# Patient Record
Sex: Female | Born: 1995 | Race: Black or African American | Hispanic: No | Marital: Single | Smoking: Former smoker
Health system: Southern US, Community
[De-identification: ages and names within clinical notes are randomized; demographics above are authoritative.]

## PROBLEM LIST (undated history)

## (undated) ENCOUNTER — Emergency Department (HOSPITAL_COMMUNITY): Admission: EM | Payer: Medicaid Other | Source: Home / Self Care

## (undated) DIAGNOSIS — D573 Sickle-cell trait: Secondary | ICD-10-CM

## (undated) DIAGNOSIS — F419 Anxiety disorder, unspecified: Secondary | ICD-10-CM

## (undated) DIAGNOSIS — F319 Bipolar disorder, unspecified: Secondary | ICD-10-CM

## (undated) DIAGNOSIS — N390 Urinary tract infection, site not specified: Secondary | ICD-10-CM

## (undated) HISTORY — PX: FOOT SURGERY: SHX648

## (undated) HISTORY — PX: DILATION AND CURETTAGE OF UTERUS: SHX78

## (undated) HISTORY — PX: NO PAST SURGERIES: SHX2092

## (undated) HISTORY — DX: Bipolar disorder, unspecified: F31.9

---
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
## 2014-02-03 ENCOUNTER — Encounter: Payer: Self-pay | Admitting: Obstetrics & Gynecology

## 2016-12-14 ENCOUNTER — Encounter (HOSPITAL_COMMUNITY): Payer: Self-pay

## 2016-12-14 DIAGNOSIS — Z3A10 10 weeks gestation of pregnancy: Secondary | ICD-10-CM | POA: Insufficient documentation

## 2016-12-14 DIAGNOSIS — J069 Acute upper respiratory infection, unspecified: Secondary | ICD-10-CM | POA: Insufficient documentation

## 2016-12-14 NOTE — ED Triage Notes (Signed)
Pt complaining of cough x 2 days. Pt denies any fevers or body aches. Pt denies any chest pain or SOB. Pt vss.

## 2016-12-15 ENCOUNTER — Emergency Department (HOSPITAL_COMMUNITY)
Admission: EM | Admit: 2016-12-15 | Discharge: 2016-12-15 | Disposition: A | Discharge status: Home / Self Care | Payer: Self-pay | Attending: Emergency Medicine | Admitting: Emergency Medicine

## 2016-12-15 DIAGNOSIS — J069 Acute upper respiratory infection, unspecified: Secondary | ICD-10-CM

## 2016-12-15 DIAGNOSIS — B9789 Other viral agents as the cause of diseases classified elsewhere: Secondary | ICD-10-CM

## 2016-12-15 NOTE — ED Provider Notes (Signed)
[facility]-EMERGENCY DEPT Provider Note   CSN: [id number] Arrival date & time: 12/14/16  2334  By signing my name below, I, Doreatha Martin, attest that this documentation has been prepared under the direction and in the presence of Brynnlee Cumpian, MD. Electronically Signed: Doreatha Martin, ED Scribe. 12/15/16. 3:46 AM.     History   Chief Complaint Chief Complaint  Patient presents with  . Cough  . Routine Prenatal Visit    HPI Laneya Gasaway is a 21 y.o. female G1P0 10w who presents to the Emergency Department complaining of constant nasal congestion x 2 days with associated cough. She states she has tried cough drops with no relief. She states she is not sure what she can take with her pregnancy. No worsening factors noted. Pt was put on bed rest by an ER physician after vaginal bleeding. She denies additional complaints.    The history is provided by the patient. No language interpreter was used.  Cough  This is a new problem. The current episode started 2 days ago. The problem occurs every few minutes. The problem has not changed since onset.The cough is non-productive. There has been no fever. Pertinent negatives include no chest pain, no chills, no shortness of breath and no wheezing. She has tried nothing (cough drops) for the symptoms. The treatment provided no relief. Her past medical history does not include bronchitis or pneumonia.    No past medical history on file.  There are no active problems to display for this patient.   No past surgical history on file.  OB History    Gravida Para Term Preterm AB Living   1             SAB TAB Ectopic Multiple Live Births                   Home Medications    Prior to Admission medications   Medication Sig Start Date End Date Taking? Authorizing Provider  amoxicillin (AMOXIL) 500 MG capsule Take 1 capsule (500 mg total) by mouth 3 (three) times daily. 11/14/12   Sunnie Nielsen, MD  ibuprofen (ADVIL,MOTRIN) 800 MG tablet Take 1 tablet  (800 mg total) by mouth 3 (three) times daily. 11/14/12   Sunnie Nielsen, MD  oxyCODONE-acetaminophen (PERCOCET/ROXICET) 5-325 MG per tablet Take 2 tablets by mouth every 4 (four) hours as needed for pain. 11/14/12   Sunnie Nielsen, MD    Family History No family history on file.  Social History Social History  Substance Use Topics  . Smoking status: Never Smoker  . Smokeless tobacco: Never Used  . Alcohol use No     Allergies   Patient has no allergy information on record.   Review of Systems Review of Systems  Constitutional: Negative for appetite change, chills and fever.  HENT: Positive for congestion. Negative for drooling and facial swelling.   Eyes: Negative for photophobia.  Respiratory: Positive for cough. Negative for shortness of breath, wheezing and stridor.   Cardiovascular: Negative for chest pain, palpitations and leg swelling.  Gastrointestinal: Negative for anal bleeding.  Genitourinary: Negative for difficulty urinating.  Musculoskeletal: Negative for neck stiffness.  Skin: Negative for pallor.  Neurological: Negative for facial asymmetry and speech difficulty.  Psychiatric/Behavioral: Negative for suicidal ideas.  All other systems reviewed and are negative.    Physical Exam Updated Vital Signs BP 122/68 (BP Location: Right Arm)   Pulse 85   Temp 98.4 F (36.9 C) (Oral)   Resp 18   Ht 5'  9" (1.753 m)   Wt 127 lb (57.6 kg)   LMP 10/29/2016 (Approximate)   SpO2 100%   BMI 18.75 kg/m   Physical Exam  Constitutional: She appears well-developed and well-nourished. No distress.  HENT:  Head: Normocephalic and atraumatic.  Mouth/Throat: Uvula is midline, oropharynx is clear and moist and mucous membranes are normal. No oropharyngeal exudate.  Eyes: Conjunctivae are normal. Pupils are equal, round, and reactive to light.  Neck: Normal range of motion. Neck supple.  No carotid bruits.   Cardiovascular: Normal rate, regular rhythm, normal heart sounds and  intact distal pulses.  Exam reveals no gallop and no friction rub.   No murmur heard. Pulmonary/Chest: Effort normal and breath sounds normal. No respiratory distress. She has no wheezes. She has no rales.  Abdominal: Soft. Bowel sounds are normal. She exhibits no distension. There is no tenderness. There is no guarding.  Musculoskeletal: Normal range of motion. She exhibits no edema, tenderness or deformity.  Neurological: She is alert. She displays normal reflexes.  Skin: Skin is warm and dry. Capillary refill takes less than 2 seconds. She is not diaphoretic.  Psychiatric: She has a normal mood and affect. Her behavior is normal.  Nursing note and vitals reviewed.    ED Treatments / Results   Vitals:   12/15/16 0330 12/15/16 0410  BP: 110/68 (!) 102/52  Pulse: 87 86  Resp:  16  Temp:      DIAGNOSTIC STUDIES: Oxygen Saturation is 100% on RA, normal by my interpretation.    COORDINATION OF CARE: 3:41 AM Discussed treatment plan with pt at bedside which includes supportive home care and pt agreed to plan.    Procedures Procedures (including critical care time)    Final Clinical Impressions(s) / ED Diagnoses  URI during pregnancy.  Tea with lemon and honey saline nasal spray follow up with your OB.  Return immediately for fever >101, chest pain shortness of breath, productive cough, intractable pain, weakness, bleeding or any concerns. Follow up with your own doctor for ongoing concerns.   The patient is nontoxic-appearing on exam and vital signs are within normal limits.   I have reviewed the triage vital signs and the nursing notes. Pertinent labs &imaging results that were available during my care of the patient were reviewed by me and considered in my medical decision making (see chart for details).  After history, exam, and medical workup I feel the patient has been appropriately medically screened and is safe for discharge home. Pertinent diagnoses were  discussed with the patient. Patient was given return precautions.    I personally performed the services described in this documentation, which was scribed in my presence. The recorded information has been reviewed and is accurate.     Keyleen Cerrato, MD 12/15/16 [phone]

## 2016-12-17 LAB — OB RESULTS CONSOLE RPR: RPR: NONREACTIVE

## 2016-12-17 LAB — OB RESULTS CONSOLE HGB/HCT, BLOOD
HEMATOCRIT: 41
HEMOGLOBIN: 13.6

## 2016-12-17 LAB — OB RESULTS CONSOLE PLATELET COUNT: PLATELETS: 270

## 2016-12-17 LAB — OB RESULTS CONSOLE RUBELLA ANTIBODY, IGM: RUBELLA: IMMUNE

## 2016-12-17 LAB — OB RESULTS CONSOLE HEPATITIS B SURFACE ANTIGEN: HEP B S AG: NEGATIVE

## 2016-12-17 LAB — OB RESULTS CONSOLE HIV ANTIBODY (ROUTINE TESTING): HIV: NONREACTIVE

## 2017-06-05 ENCOUNTER — Emergency Department (HOSPITAL_COMMUNITY): Payer: Federal, State, Local not specified - PPO

## 2017-06-05 ENCOUNTER — Other Ambulatory Visit: Payer: Self-pay

## 2017-06-05 ENCOUNTER — Emergency Department (HOSPITAL_COMMUNITY)
Admission: EM | Admit: 2017-06-05 | Discharge: 2017-06-06 | Disposition: A | Discharge status: Home / Self Care | Payer: Federal, State, Local not specified - PPO | Attending: Emergency Medicine | Admitting: Emergency Medicine

## 2017-06-05 ENCOUNTER — Encounter (HOSPITAL_COMMUNITY): Payer: Self-pay | Admitting: Emergency Medicine

## 2017-06-05 DIAGNOSIS — G43009 Migraine without aura, not intractable, without status migrainosus: Secondary | ICD-10-CM

## 2017-06-05 DIAGNOSIS — R531 Weakness: Secondary | ICD-10-CM | POA: Diagnosis not present

## 2017-06-05 DIAGNOSIS — O99355 Diseases of the nervous system complicating the puerperium: Secondary | ICD-10-CM | POA: Diagnosis not present

## 2017-06-05 DIAGNOSIS — R200 Anesthesia of skin: Secondary | ICD-10-CM | POA: Diagnosis not present

## 2017-06-05 DIAGNOSIS — Z3A32 32 weeks gestation of pregnancy: Secondary | ICD-10-CM | POA: Diagnosis not present

## 2017-06-05 HISTORY — DX: Urinary tract infection, site not specified: N39.0

## 2017-06-05 LAB — CBC WITH DIFFERENTIAL/PLATELET
Basophils Absolute: 0 10*3/uL (ref 0.0–0.1)
Basophils Relative: 0 %
EOS ABS: 0.1 10*3/uL (ref 0.0–0.7)
Eosinophils Relative: 1 %
HEMATOCRIT: 29.8 % — AB (ref 36.0–46.0)
HEMOGLOBIN: 10.1 g/dL — AB (ref 12.0–15.0)
LYMPHS ABS: 2.3 10*3/uL (ref 0.7–4.0)
Lymphocytes Relative: 29 %
MCH: 27.3 pg (ref 26.0–34.0)
MCHC: 33.9 g/dL (ref 30.0–36.0)
MCV: 80.5 fL (ref 78.0–100.0)
MONOS PCT: 10 %
Monocytes Absolute: 0.8 10*3/uL (ref 0.1–1.0)
NEUTROS PCT: 60 %
Neutro Abs: 4.5 10*3/uL (ref 1.7–7.7)
Platelets: 267 10*3/uL (ref 150–400)
RBC: 3.7 MIL/uL — ABNORMAL LOW (ref 3.87–5.11)
RDW: 13 % (ref 11.5–15.5)
WBC: 7.7 10*3/uL (ref 4.0–10.5)

## 2017-06-05 LAB — COMPREHENSIVE METABOLIC PANEL
ALK PHOS: 91 U/L (ref 38–126)
ALT: 16 U/L (ref 14–54)
ANION GAP: 8 (ref 5–15)
AST: 25 U/L (ref 15–41)
Albumin: 3.3 g/dL — ABNORMAL LOW (ref 3.5–5.0)
BILIRUBIN TOTAL: 0.5 mg/dL (ref 0.3–1.2)
BUN: <5 mg/dL — ABNORMAL LOW (ref 6–20)
CHLORIDE: 103 mmol/L (ref 101–111)
CO2: 26 mmol/L (ref 22–32)
CREATININE: 0.59 mg/dL (ref 0.44–1.00)
Calcium: 8.9 mg/dL (ref 8.9–10.3)
GFR calc non Af Amer: >60 mL/min (ref >60)
Glucose, Bld: 76 mg/dL (ref 65–99)
POTASSIUM: 3.2 mmol/L — AB (ref 3.5–5.1)
SODIUM: 137 mmol/L (ref 135–145)
Total Protein: 6.2 g/dL — ABNORMAL LOW (ref 6.5–8.1)

## 2017-06-05 MED ORDER — LORAZEPAM 2 MG/ML IJ SOLN
1.0000 mg | Freq: Once | INTRAMUSCULAR | Status: AC
  Administered 2017-06-05: 1 mg via INTRAVENOUS

## 2017-06-05 MED ORDER — PROCHLORPERAZINE EDISYLATE 5 MG/ML IJ SOLN
10.0000 mg | Freq: Once | INTRAMUSCULAR | Status: AC
  Administered 2017-06-05: 10 mg via INTRAVENOUS
  Filled 2017-06-05: qty 2

## 2017-06-05 MED ORDER — DIPHENHYDRAMINE HCL 50 MG/ML IJ SOLN
25.0000 mg | Freq: Once | INTRAMUSCULAR | Status: AC
  Administered 2017-06-05: 25 mg via INTRAVENOUS
  Filled 2017-06-05: qty 1

## 2017-06-05 MED ORDER — LORAZEPAM 2 MG/ML IJ SOLN
INTRAMUSCULAR | Status: AC
  Filled 2017-06-05: qty 1

## 2017-06-05 MED ORDER — SODIUM CHLORIDE 0.9 % IV BOLUS (SEPSIS)
1000.0000 mL | Freq: Once | INTRAVENOUS | Status: AC
  Administered 2017-06-05: 1000 mL via INTRAVENOUS

## 2017-06-05 NOTE — ED Notes (Signed)
MD Garza at bedside 

## 2017-06-05 NOTE — ED Notes (Signed)
Pt more anxious and agitated after compazine and benadryl administration. MD little notified. Order for 25 more mg benadryl.

## 2017-06-05 NOTE — ED Notes (Signed)
Pt resting at this time.

## 2017-06-05 NOTE — ED Notes (Signed)
OB RR RN at bedside. 

## 2017-06-05 NOTE — ED Provider Notes (Signed)
[facility] EMERGENCY DEPARTMENT Provider Note   CSN: [id number] Arrival date & time: 06/05/17  2115 History   Chief Complaint Chief Complaint  Patient presents with  . Numbness   HPI Tina Thomas is a 21 y.o. female who is 32 weeks pregnancy presents with acute onset headache associated with visual loss in the right eye and patient states she was cooking when she developed sudden pain in the left side of her head and then noted that she could not see had numbness in her right face upper extremity.  Patient states symptoms have been persistent since onset was approximately 45 minutes ago.  She patient denies being his major symptoms better or worse.  States that she has had similar symptoms previously with headache and tingling while pregnant, however symptoms have not been this severe.  She is extremely tearful and anxious.  HPI  Past Medical History:  Diagnosis Date  . UTI (urinary tract infection)    There are no active problems to display for this patient.  History reviewed. No pertinent surgical history.  OB History    Gravida Para Term Preterm AB Living   1             SAB TAB Ectopic Multiple Live Births                 Home Medications    Prior to Admission medications   Medication Sig Start Date End Date Taking? Authorizing Provider  amoxicillin (AMOXIL) 500 MG capsule Take 1 capsule (500 mg total) by mouth 3 (three) times daily. 11/14/12   Sunnie Nielsen, MD  ibuprofen (ADVIL,MOTRIN) 800 MG tablet Take 1 tablet (800 mg total) by mouth 3 (three) times daily. 11/14/12   Sunnie Nielsen, MD  oxyCODONE-acetaminophen (PERCOCET/ROXICET) 5-325 MG per tablet Take 2 tablets by mouth every 4 (four) hours as needed for pain. 11/14/12   Sunnie Nielsen, MD   Family History No family history on file.  Social History Social History   Tobacco Use  . Smoking status: Never Smoker  . Smokeless tobacco: Never Used  Substance Use Topics  . Alcohol use: No  . Drug use:  No   Allergies   Patient has no known allergies.  Review of Systems Review of Systems  Constitutional: Negative for chills and fever.  HENT: Negative for ear pain and sore throat.   Eyes: Positive for visual disturbance. Negative for pain.  Respiratory: Negative for cough and shortness of breath.   Cardiovascular: Negative for chest pain and palpitations.  Gastrointestinal: Negative for abdominal pain and vomiting.  Genitourinary: Negative for dysuria and hematuria.  Musculoskeletal: Negative for arthralgias and back pain.  Skin: Negative for color change and rash.  Neurological: Positive for weakness, numbness and headaches. Negative for seizures and syncope.  All other systems reviewed and are negative.  Physical Exam Updated Vital Signs BP 133/76   Pulse 97   Temp (!) 97.4 F (36.3 C) (Oral)   Resp 16   LMP 10/29/2016 (Approximate)   SpO2 100%   Physical Exam  Constitutional: She is oriented to person, place, and time. She appears well-developed and well-nourished. No distress.  HENT:  Head: Normocephalic and atraumatic.  Eyes: Conjunctivae and EOM are normal. Pupils are equal, round, and reactive to light.  Neck: Normal range of motion. Neck supple.  Cardiovascular: Normal rate and regular rhythm.  No murmur heard. Pulmonary/Chest: Effort normal and breath sounds normal. Tachypnea noted. No respiratory distress.  Abdominal: Soft. There is no tenderness.  Musculoskeletal: She exhibits no edema.  Neurological: She is alert and oriented to person, place, and time. She displays normal reflexes. No cranial nerve deficit or sensory deficit. She exhibits normal muscle tone. Coordination normal.  CN 2-12 normal, no facial droop 5/5 left grip strength 5/5 left flexion and extension of the wrist 5/5 left bicep and triceps extension Difficult to obtain exam of the RUE as the patient is poorly cooperative and extremely anxious, however is noted to be able to move the RUE  spontaneously and purposefull Normal strength and ROM of the bilateral lower extremities  Normal gait  Skin: Skin is warm and dry.  Psychiatric: Her mood appears anxious.  Nursing note and vitals reviewed.  ED Treatments / Results  Labs (all labs ordered are listed, but only abnormal results are displayed) Labs Reviewed  CBC WITH DIFFERENTIAL/PLATELET - Abnormal; Notable for the following components:      Result Value   RBC 3.70 (*)    Hemoglobin 10.1 (*)    HCT 29.8 (*)    All other components within normal limits  COMPREHENSIVE METABOLIC PANEL - Abnormal; Notable for the following components:   Potassium 3.2 (*)    BUN <5 (*)    Total Protein 6.2 (*)    Albumin 3.3 (*)    All other components within normal limits  URINALYSIS, ROUTINE W REFLEX MICROSCOPIC   EKG  EKG Interpretation  Date/Time:  Thursday June 05 2017 21:34:46 EST Ventricular Rate:  92 PR Interval:    QRS Duration: 78 QT Interval:  343 QTC Calculation: 420 R Axis:   58 Text Interpretation:  Sinus rhythm Ventricular premature complex Nonspecific T abnormalities, anterior leads T wave inversion III, V3, no previous Confirmed by Frederick PeersLittle, Rachel [phone]([id number]) on 06/05/2017 10:23:43 PM     Radiology Ct Head Wo Contrast  Result Date: 06/06/2017 CLINICAL DATA:  21 year old female with right-sided weakness and numbness and blurred vision. Concern for intracranial hemorrhage. EXAM: CT HEAD WITHOUT CONTRAST TECHNIQUE: Contiguous axial images were obtained from the base of the skull through the vertex without intravenous contrast. COMPARISON:  None. FINDINGS: Brain: The ventricles and sulci appropriate size for patient's age. A subcentimeter hypodense focus in the region of the left basal ganglia (series 3, image 13) likely represents a prevascular space. An age indeterminate small lacunar infarct is less likely. Clinical correlation is recommended. There is no acute intracranial hemorrhage. No mass effect or midline shift  noted. No extra-axial fluid collection. Vascular: No hyperdense vessel or unexpected calcification. Skull: Normal. Negative for fracture or focal lesion. Sinuses/Orbits: No acute finding. Other: None IMPRESSION: 1. No acute intracranial hemorrhage. 2. Tiny hypodense focus in the region of the left basal ganglia, likely a prevascular space. If symptoms persist, and there are no contraindications, MRI may provide better evaluation if clinically indicated. Electronically Signed   By: Elgie CollardArash  Radparvar M.D.   On: 06/06/2017 00:05    Procedures Procedures (including critical care time)  Medications Ordered in ED Medications  sodium chloride 0.9 % bolus 1,000 mL (1,000 mLs Intravenous New Bag/Given 06/05/17 2206)  diphenhydrAMINE (BENADRYL) injection 25 mg (25 mg Intravenous Given 06/05/17 2206)  prochlorperazine (COMPAZINE) injection 10 mg (10 mg Intravenous Given 06/05/17 2206)  diphenhydrAMINE (BENADRYL) injection 25 mg (25 mg Intravenous Given 06/05/17 2227)  LORazepam (ATIVAN) injection 1 mg (1 mg Intravenous Given 06/05/17 2238)   Initial Impression / Assessment and Plan / ED Course  I have reviewed the triage vital signs and the nursing notes.  Pertinent labs &  imaging results that were available during my care of the patient were reviewed by me and considered in my medical decision making (see chart for details).  Tina MattersJada Thomas 21 year old female who is currently [redacted] weeks pregnant who presents with acute onset left-sided headache associated with right-sided vision loss and right upper extremity numbness and weakness.  My initial assessment patient is tearful and appears extremely anxious.  She is difficult to console.  She is hemodynamically stable with normal heart rate and normal blood pressure on my assessment.  My neuro exam patient's pupils are equal round and reactive to light with extraocular movement intact.  Patient denies that she is anything out of the right eye.  Although she  endorses numbness and weakness in the right upper extremity she withdraws to pain and is able to move the right upper extremity purposefully and spontaneously.  Fetal heart rate obtained on bedside ultrasound revealed heart rate of 140.  CT head ordered due to concerns for possible subarachnoid hemorrhage due to patient's description of headache with neurological symptoms.  Patient IV pain medications for concerns of possible complex migraine  CT head full report detailed above in short review no acute intracranial hemorrhage, tiny hypodense region in the left basal ganglia representative of perivascular space  My reassessment the patient after receiving medications patient is sleepy but states that her symptoms have completely resolved.  She states that she no longer has a headache and that her vision is normal and that she has no numbness or weakness in the right upper extremity.  On my neurological exam she has full grip strength, bicep flexion, and tricep extension on the right and left upper extremities.  Discussed incidental findings on CT scan with the patient and her mom at bedside.  And that I discussed these with Dr. Laurence SlateAroor, neurology who reviewed the patient's images and agrees that most likely prevascular space and of little significance if the patient's symptoms have completely resolved.  At this time is a patient is asymptomatic and has a normal neurological exam feel that she is medically cleared.  Patient cleared obstetrically will be as her blood pressure has been within normal range she has no proteinuria, normal LFTs, and normal platelets. Please refer to their note for further information.  At this time consider the patient is safe and stable for discharge.  Discussed following up with neurology outpatient for further evaluation of complex migraines.  Strict return precautions given for recurrent symptoms, new or worsening symptoms.  Patient and her mother at bedside are in  agreement with plan.  Given information on resources to set up obstetric care in the area as the patient has recently moved here from [place].  Final Clinical Impressions(s) / ED Diagnoses   Final diagnoses:  Migraine without aura and without status migrainosus, not intractable   ED Discharge Orders    None       Lamont SnowballGarza, Idali Lafever, MD 06/07/17 0037    Clarene DukeLittle, Ambrose Finlandachel Morgan, MD 06/08/17 [phone]

## 2017-06-05 NOTE — ED Triage Notes (Addendum)
Pt arrives via POV with sudden onset R weakness, numbness and tingling. Pt also reports R eye blurred vision and vomiting. Denies hx migraines, but reports headaches. Estimated LSW 2045. Dr. Clarene DukeLittle at bedside at this time - no code stroke at this time per Dr. Clarene DukeLittle.

## 2017-06-05 NOTE — ED Notes (Signed)
Continued agitation, unable to sit still in bed. MD notified, see new orders.

## 2017-06-06 LAB — URINALYSIS, ROUTINE W REFLEX MICROSCOPIC
Bilirubin Urine: NEGATIVE
GLUCOSE, UA: NEGATIVE mg/dL
HGB URINE DIPSTICK: NEGATIVE
Ketones, ur: NEGATIVE mg/dL
LEUKOCYTES UA: NEGATIVE
Nitrite: NEGATIVE
PH: 7 (ref 5.0–8.0)
Protein, ur: NEGATIVE mg/dL
SPECIFIC GRAVITY, URINE: 1.005 (ref 1.005–1.030)

## 2017-06-06 LAB — PROTEIN / CREATININE RATIO, URINE
Creatinine, Urine: 41.71 mg/dL
Total Protein, Urine: <6 mg/dL

## 2017-06-06 LAB — RAPID URINE DRUG SCREEN, HOSP PERFORMED
AMPHETAMINES: NOT DETECTED
Barbiturates: NOT DETECTED
Benzodiazepines: NOT DETECTED
COCAINE: NOT DETECTED
OPIATES: NOT DETECTED
TETRAHYDROCANNABINOL: NOT DETECTED

## 2017-06-06 NOTE — Discharge Instructions (Addendum)
There was an abnormality on you CT scan that we discussed: Tiny hypodense focus in the region of the left basal ganglia, likely a prevascular space. These findings were discussed the neurology who at this time feels that they are not likely significant as all your symptoms have resolved. However it would be reasonable for you to follow up with neurology in 1-2 weeks for reassessment and evaluation of complex migraine.  At this time your symptoms are most likely due to a complex migraine headache as they have resolved with medication and with resolution of your headache. Should you experience symptoms like this again I advise that you return immediately to the emergency department for re-evaluation.

## 2017-06-06 NOTE — ED Notes (Signed)
Patient left at this time with all belongings. 

## 2017-06-06 NOTE — Progress Notes (Signed)
Dr Erin FullingHarraway-Smith called with PCR results, CT results, and EFM; patient states she feels better and blurred vision and headache are gone; orders given to clear patient obstetrically and patient able to go home and follow up in office; ED physician made aware at this time

## 2017-06-06 NOTE — ED Notes (Signed)
Pt refusing in and out cath, attempted bedpan. Wants to sit on pan for a little while. OB RR RN remains at bedside

## 2017-06-06 NOTE — Progress Notes (Signed)
RROB called to patient's bedside who presents to [facility] ED with severe headache focused on the left side with blurred vision and right sided weakness; patient is a G1P0 who is [redacted] weeks along in her pregnancy at this time; patient's mom stated she was eating something then started vomiting and immediately afterwards the pain started to get worse; EFM applied;  patient was given benadryl IV and compazine IV and patient became very restless and anxious; removing monitors and flipping around in bed; unable to keep patient on EFM; patient given ativan IV and became less agitated at that point; [facility] Dr Erin FullingHarraway-Smith called at this time and informed of patient's complaints, EFM tracing, and VS orders given to keep patient on EFM at this time and obtain a protein creat ratio and wait for CT results; will continue to monitor

## 2017-06-06 NOTE — ED Notes (Signed)
MD Miquel DunnGarza at bedside

## 2017-06-06 NOTE — ED Notes (Signed)
Pt attempted to void x1 with bedpan, unsuccessful. Will reattempt

## 2017-07-22 NOTE — L&D Delivery Note (Signed)
Patient is 22 y.o. G1P0 6448w3d admitted for SOL. S/p Pitocin. AROM at 1539.  Prenatal course also complicated by poor prenatal records as [place]  Delivery Note At 6:47 PM a viable female was delivered via  (Presentation: ROA;  ).  APGAR: pending; weight  pending.   Placenta status: spontaneous, intact.  Cord: 3 vessel  Anesthesia:  Epidural Episiotomy:  None Lacerations: None Est. Blood Loss (mL):  50 cc   Head delivered ROA. No nuchal cord present. Shoulder and body delivered. Posterior arm delivered before anterior shoulder. Infant with spontaneous cry, placed on mother's abdomen, dried and bulb suctioned. Cord clamped x 2 after 1-minute delay, and cut by family member. Cord blood drawn. Placenta delivered spontaneously with gentle cord traction. Fundus firm with massage and Pitocin. Perineum inspected and found to have no laceration, which was found to be hemostatic.  Mom to postpartum.  Baby to Couplet care / Skin to Skin.  Sherin Darin Engelsbraham 07/27/2017, 6:57 PM I was present and supervised this delivery

## 2017-07-27 ENCOUNTER — Observation Stay (HOSPITAL_COMMUNITY): Payer: Federal, State, Local not specified - PPO | Admitting: Anesthesiology

## 2017-07-27 ENCOUNTER — Inpatient Hospital Stay (HOSPITAL_COMMUNITY)
Admission: AD | Admit: 2017-07-27 | Discharge: 2017-07-29 | DRG: 807 | Disposition: A | Discharge status: Home / Self Care | Payer: Federal, State, Local not specified - PPO | Source: Ambulatory Visit | Attending: Obstetrics and Gynecology | Admitting: Obstetrics and Gynecology

## 2017-07-27 ENCOUNTER — Encounter (HOSPITAL_COMMUNITY): Payer: Self-pay

## 2017-07-27 ENCOUNTER — Other Ambulatory Visit: Payer: Self-pay

## 2017-07-27 DIAGNOSIS — Z87891 Personal history of nicotine dependence: Secondary | ICD-10-CM | POA: Diagnosis not present

## 2017-07-27 DIAGNOSIS — Z3483 Encounter for supervision of other normal pregnancy, third trimester: Secondary | ICD-10-CM | POA: Diagnosis present

## 2017-07-27 DIAGNOSIS — I10 Essential (primary) hypertension: Secondary | ICD-10-CM | POA: Diagnosis not present

## 2017-07-27 DIAGNOSIS — Z3490 Encounter for supervision of normal pregnancy, unspecified, unspecified trimester: Secondary | ICD-10-CM

## 2017-07-27 DIAGNOSIS — O135 Gestational [pregnancy-induced] hypertension without significant proteinuria, complicating the puerperium: Principal | ICD-10-CM | POA: Diagnosis not present

## 2017-07-27 DIAGNOSIS — Z3A38 38 weeks gestation of pregnancy: Secondary | ICD-10-CM | POA: Diagnosis not present

## 2017-07-27 HISTORY — DX: Sickle-cell trait: D57.3

## 2017-07-27 LAB — DIFFERENTIAL
Basophils Absolute: 0 10*3/uL (ref 0.0–0.1)
Basophils Relative: 0 %
EOS ABS: 0.1 10*3/uL (ref 0.0–0.7)
EOS PCT: 1 %
LYMPHS ABS: 2.1 10*3/uL (ref 0.7–4.0)
Lymphocytes Relative: 26 %
Monocytes Absolute: 0.6 10*3/uL (ref 0.1–1.0)
Monocytes Relative: 8 %
NEUTROS PCT: 65 %
Neutro Abs: 5.4 10*3/uL (ref 1.7–7.7)

## 2017-07-27 LAB — ABO/RH: ABO/RH(D): B POS

## 2017-07-27 LAB — RAPID HIV SCREEN (HIV 1/2 AB+AG)
HIV 1/2 ANTIBODIES: NONREACTIVE
HIV-1 P24 ANTIGEN - HIV24: NONREACTIVE

## 2017-07-27 LAB — CBC
HCT: 33.2 % — ABNORMAL LOW (ref 36.0–46.0)
Hemoglobin: 11.4 g/dL — ABNORMAL LOW (ref 12.0–15.0)
MCH: 26.5 pg (ref 26.0–34.0)
MCHC: 34.3 g/dL (ref 30.0–36.0)
MCV: 77.2 fL — AB (ref 78.0–100.0)
PLATELETS: 294 10*3/uL (ref 150–400)
RBC: 4.3 MIL/uL (ref 3.87–5.11)
RDW: 13.8 % (ref 11.5–15.5)
WBC: 8.2 10*3/uL (ref 4.0–10.5)

## 2017-07-27 LAB — RAPID URINE DRUG SCREEN, HOSP PERFORMED
Amphetamines: NOT DETECTED
BARBITURATES: NOT DETECTED
BENZODIAZEPINES: NOT DETECTED
COCAINE: NOT DETECTED
OPIATES: NOT DETECTED
Tetrahydrocannabinol: NOT DETECTED

## 2017-07-27 LAB — TYPE AND SCREEN
ABO/RH(D): B POS
ANTIBODY SCREEN: NEGATIVE

## 2017-07-27 LAB — HEPATITIS B SURFACE ANTIGEN: Hepatitis B Surface Ag: NEGATIVE

## 2017-07-27 MED ORDER — LIDOCAINE HCL (PF) 1 % IJ SOLN
INTRAMUSCULAR | Status: DC | PRN
  Administered 2017-07-27 (×2): 5 mL

## 2017-07-27 MED ORDER — SOD CITRATE-CITRIC ACID 500-334 MG/5ML PO SOLN: 30.0000 mL | ORAL | Status: DC | PRN

## 2017-07-27 MED ORDER — DIPHENHYDRAMINE HCL 50 MG/ML IJ SOLN
12.5000 mg | INTRAMUSCULAR | Status: DC | PRN
  Administered 2017-07-27: 12.5 mg via INTRAVENOUS
  Filled 2017-07-27: qty 1

## 2017-07-27 MED ORDER — LACTATED RINGERS IV SOLN: 500.0000 mL | INTRAVENOUS | Status: DC | PRN

## 2017-07-27 MED ORDER — SENNOSIDES-DOCUSATE SODIUM 8.6-50 MG PO TABS
2.0000 | ORAL_TABLET | ORAL | Status: DC
  Administered 2017-07-28 (×2): 2 via ORAL
  Filled 2017-07-27 (×2): qty 2

## 2017-07-27 MED ORDER — ACETAMINOPHEN 325 MG PO TABS: 650.0000 mg | ORAL_TABLET | ORAL | Status: DC | PRN

## 2017-07-27 MED ORDER — EPHEDRINE 5 MG/ML INJ
10.0000 mg | INTRAVENOUS | Status: DC | PRN
  Filled 2017-07-27: qty 2

## 2017-07-27 MED ORDER — COCONUT OIL OIL
1.0000 "application " | TOPICAL_OIL | Status: DC | PRN
  Administered 2017-07-28: 1 via TOPICAL
  Filled 2017-07-27: qty 120

## 2017-07-27 MED ORDER — OXYCODONE-ACETAMINOPHEN 5-325 MG PO TABS: 2.0000 | ORAL_TABLET | ORAL | Status: DC | PRN

## 2017-07-27 MED ORDER — LACTATED RINGERS IV SOLN
500.0000 mL | Freq: Once | INTRAVENOUS | Status: AC
  Administered 2017-07-27: 500 mL via INTRAVENOUS

## 2017-07-27 MED ORDER — LACTATED RINGERS IV SOLN
INTRAVENOUS | Status: DC
  Administered 2017-07-27: 14:00:00 via INTRAVENOUS

## 2017-07-27 MED ORDER — HYDROXYZINE HCL 50 MG/ML IM SOLN
50.0000 mg | Freq: Four times a day (QID) | INTRAMUSCULAR | Status: DC | PRN
  Administered 2017-07-27: 50 mg via INTRAMUSCULAR
  Filled 2017-07-27 (×2): qty 1

## 2017-07-27 MED ORDER — WITCH HAZEL-GLYCERIN EX PADS: 1.0000 "application " | MEDICATED_PAD | CUTANEOUS | Status: DC | PRN

## 2017-07-27 MED ORDER — ONDANSETRON HCL 4 MG/2ML IJ SOLN: 4.0000 mg | Freq: Four times a day (QID) | INTRAMUSCULAR | Status: DC | PRN

## 2017-07-27 MED ORDER — LIDOCAINE HCL (PF) 1 % IJ SOLN
30.0000 mL | INTRAMUSCULAR | Status: DC | PRN
  Filled 2017-07-27: qty 30

## 2017-07-27 MED ORDER — ACETAMINOPHEN 325 MG PO TABS
650.0000 mg | ORAL_TABLET | ORAL | Status: DC | PRN
  Administered 2017-07-29: 650 mg via ORAL
  Filled 2017-07-27: qty 2

## 2017-07-27 MED ORDER — OXYTOCIN 40 UNITS IN LACTATED RINGERS INFUSION - SIMPLE MED
1.0000 m[IU]/min | INTRAVENOUS | Status: DC
  Administered 2017-07-27: 4 m[IU]/min via INTRAVENOUS

## 2017-07-27 MED ORDER — PRENATAL MULTIVITAMIN CH
1.0000 | ORAL_TABLET | Freq: Every day | ORAL | Status: DC
  Administered 2017-07-28 – 2017-07-29 (×2): 1 via ORAL
  Filled 2017-07-27 (×2): qty 1

## 2017-07-27 MED ORDER — SIMETHICONE 80 MG PO CHEW: 80.0000 mg | CHEWABLE_TABLET | ORAL | Status: DC | PRN

## 2017-07-27 MED ORDER — OXYTOCIN BOLUS FROM INFUSION
500.0000 mL | Freq: Once | INTRAVENOUS | Status: AC
  Administered 2017-07-27: 500 mL via INTRAVENOUS

## 2017-07-27 MED ORDER — PHENYLEPHRINE 40 MCG/ML (10ML) SYRINGE FOR IV PUSH (FOR BLOOD PRESSURE SUPPORT)
80.0000 ug | PREFILLED_SYRINGE | INTRAVENOUS | Status: DC | PRN
  Filled 2017-07-27: qty 10
  Filled 2017-07-27: qty 5

## 2017-07-27 MED ORDER — TETANUS-DIPHTH-ACELL PERTUSSIS 5-2.5-18.5 LF-MCG/0.5 IM SUSP: 0.5000 mL | Freq: Once | INTRAMUSCULAR | Status: DC

## 2017-07-27 MED ORDER — DIPHENHYDRAMINE HCL 25 MG PO CAPS: 25.0000 mg | ORAL_CAPSULE | Freq: Four times a day (QID) | ORAL | Status: DC | PRN

## 2017-07-27 MED ORDER — BENZOCAINE-MENTHOL 20-0.5 % EX AERO
1.0000 "application " | INHALATION_SPRAY | CUTANEOUS | Status: DC | PRN
  Administered 2017-07-27: 1 via TOPICAL
  Filled 2017-07-27: qty 56

## 2017-07-27 MED ORDER — OXYTOCIN 40 UNITS IN LACTATED RINGERS INFUSION - SIMPLE MED
2.5000 [IU]/h | INTRAVENOUS | Status: DC
  Administered 2017-07-27: 2.5 [IU]/h via INTRAVENOUS
  Filled 2017-07-27: qty 1000

## 2017-07-27 MED ORDER — ONDANSETRON HCL 4 MG PO TABS: 4.0000 mg | ORAL_TABLET | ORAL | Status: DC | PRN

## 2017-07-27 MED ORDER — TERBUTALINE SULFATE 1 MG/ML IJ SOLN
0.2500 mg | Freq: Once | INTRAMUSCULAR | Status: DC | PRN
  Filled 2017-07-27: qty 1

## 2017-07-27 MED ORDER — FENTANYL 2.5 MCG/ML BUPIVACAINE 1/10 % EPIDURAL INFUSION (WH - ANES)
14.0000 mL/h | INTRAMUSCULAR | Status: DC | PRN
  Administered 2017-07-27: 14 mL/h via EPIDURAL
  Filled 2017-07-27: qty 100

## 2017-07-27 MED ORDER — OXYCODONE-ACETAMINOPHEN 5-325 MG PO TABS: 1.0000 | ORAL_TABLET | ORAL | Status: DC | PRN

## 2017-07-27 MED ORDER — HYDROXYZINE HCL 50 MG PO TABS: 25.0000 mg | ORAL_TABLET | Freq: Once | ORAL | Status: DC

## 2017-07-27 MED ORDER — IBUPROFEN 600 MG PO TABS
600.0000 mg | ORAL_TABLET | Freq: Four times a day (QID) | ORAL | Status: DC
  Administered 2017-07-28 – 2017-07-29 (×6): 600 mg via ORAL
  Filled 2017-07-27 (×7): qty 1

## 2017-07-27 MED ORDER — ONDANSETRON HCL 4 MG/2ML IJ SOLN: 4.0000 mg | INTRAMUSCULAR | Status: DC | PRN

## 2017-07-27 MED ORDER — DIBUCAINE 1 % RE OINT: 1.0000 "application " | TOPICAL_OINTMENT | RECTAL | Status: DC | PRN

## 2017-07-27 MED ORDER — ZOLPIDEM TARTRATE 5 MG PO TABS: 5.0000 mg | ORAL_TABLET | Freq: Every evening | ORAL | Status: DC | PRN

## 2017-07-27 MED ORDER — PHENYLEPHRINE 40 MCG/ML (10ML) SYRINGE FOR IV PUSH (FOR BLOOD PRESSURE SUPPORT)
80.0000 ug | PREFILLED_SYRINGE | INTRAVENOUS | Status: DC | PRN
Start: 2017-07-27 — End: 2017-07-27
  Filled 2017-07-27: qty 5

## 2017-07-27 NOTE — Progress Notes (Addendum)
G1 @ 38.[redacted] wksga. Presents to triage for r/o labor. States ctx stronger today than previous days. Denies LOF but has bloody show, dark pinkish mucus. +FM. EFM applied.   SVE 3/60/-2  VSS see flow sheet for details.   1207: provider notified. Report status of pt given. Orders received to walk pt for an hr and recheck cervix.  1210: pt sent walking. Instructed to return at 1300  1310: back from walking. Pt states pain is too strong to cont.  Provider notified. Report given. Orders received to admit pt.   1325: Birthing charge nurse notified. Report status of pt given. Room assigned to 170.   1437: charge nurse ordered to sent pt to birthing without labs and IV since MAU too busy.

## 2017-07-27 NOTE — Plan of Care (Signed)
Progressing appropriately.

## 2017-07-27 NOTE — Anesthesia Preprocedure Evaluation (Signed)

## 2017-07-27 NOTE — MAU Note (Signed)
Contractions started 2 days ago-now are every 5-496min, hasn't slept in 2 days.  Had some watery d/c 2 days ago, bleeding yesterday.  Was 2 cm when last checked.

## 2017-07-27 NOTE — Progress Notes (Signed)
Tina MattersJada Thomas is a [age] y.o. G1P0 at 5528w3d no records as [place] for early labor  Subjective: Patient doing well. Cousin and aunt at bedside for support. Patient reports some back pain. States "i'll be fine as long as I have ice cream and pain meds". Reports she is having a girl, desires to breast feed, desires depo provera shot for birth control.   Objective: BP 137/75   Pulse 77   Temp 98 F (36.7 C) (Oral)   Resp 16   Ht 5\' 9"  (1.753 m)   Wt 70 kg (154 lb 4 oz)   LMP 10/29/2016 (Approximate)   SpO2 100%   BMI 22.78 kg/m  No intake/output data recorded. No intake/output data recorded.  FHT:  FHR: 130 bpm, variability: moderate,  accelerations:  Abscent,  decelerations:  Present early UC:   irregular, every 1-3 minutes SVE:   Dilation: 5 Effacement (%): 90, 100 Station: 0 Exam by:: Tina CruiseHeather Thomas RNC   Labs: Lab Results  Component Value Date   WBC 8.2 07/27/2017   HGB 11.4 (L) 07/27/2017   HCT 33.2 (L) 07/27/2017   MCV 77.2 (L) 07/27/2017   PLT 294 07/27/2017    Assessment / Plan: Spontaneous labor, progressing normally  Labor: Progressing normally. Rapid HIV non-reactive, UDS neg, B positive blood type Fetal Wellbeing:  Category I Pain Control:  Epidural I/D:  n/a Anticipated MOD:  NSVD  Tina Thomas 07/27/2017, 4:23 PM

## 2017-07-27 NOTE — H&P (Signed)
Tina MattersJada Thomas is a 22 y.o. female G1 @ 38.3 wks presenting for contractions. [place] Will attempt to get records. OB History    Gravida Para Term Preterm AB Living   1             SAB TAB Ectopic Multiple Live Births                 Past Medical History:  Diagnosis Date  . UTI (urinary tract infection)    Past Surgical History:  Procedure Laterality Date  . NO PAST SURGERIES     Family History: family history includes Cancer in her maternal grandmother; Diabetes in her sister; Hypertension in her mother. Social History:  reports that she has quit smoking. she has never used smokeless tobacco. She reports that she drinks alcohol. She reports that she does not use drugs.     Maternal Diabetes: No Genetic Screening: Normal Maternal Ultrasounds/Referrals: Normal Fetal Ultrasounds or other Referrals:  None Maternal Substance Abuse:  No Significant Maternal Medications:  None Significant Maternal Lab Results:  None Other Comments:  do not have pts prenatal records. Care in [place]  Review of Systems  Constitutional: Negative.   HENT: Negative.   Eyes: Negative.   Respiratory: Negative.   Cardiovascular: Negative.   Gastrointestinal: Positive for abdominal pain.  Genitourinary: Negative.   Musculoskeletal: Negative.   Skin: Negative.   Neurological: Negative.   Endo/Heme/Allergies: Negative.   Psychiatric/Behavioral: Negative.    Maternal Medical History:  Reason for admission: Contractions.   Contractions: Onset was 13-24 hours ago.   Frequency: regular.   Perceived severity is moderate.    Fetal activity: Perceived fetal activity is normal.   Last perceived fetal movement was within the past hour.    Prenatal complications: no prenatal complications Prenatal Complications - Diabetes: none.    Dilation: 3.5 Effacement (%): 60 Station: 0 Exam by:: Charna ArcherJ McClellan, RN Weight 154 lb 4 oz (70 kg), last menstrual period  10/29/2016. Maternal Exam:  Uterine Assessment: Contraction strength is moderate.  Contraction frequency is regular.   Abdomen: Patient reports no abdominal tenderness. Fetal presentation: vertex  Introitus: Normal vulva. Normal vagina.  Ferning test: not done.  Nitrazine test: not done.  Pelvis: adequate for delivery.   Cervix: Cervix evaluated by digital exam.     Fetal Exam Fetal Monitor Review: Mode: ultrasound.   Baseline rate: 140's.  Variability: moderate (6-25 bpm).   Pattern: accelerations present.    Fetal State Assessment: Category I - tracings are normal.     Physical Exam  Constitutional: She is oriented to person, place, and time. She appears well-developed and well-nourished.  HENT:  Head: Normocephalic.  Eyes: Pupils are equal, round, and reactive to light.  Neck: Normal range of motion.  Cardiovascular: Normal rate, regular rhythm, normal heart sounds and intact distal pulses.  Respiratory: Effort normal and breath sounds normal.  GI: Soft. Bowel sounds are normal.  Genitourinary: Vagina normal and uterus normal.  Musculoskeletal: Normal range of motion.  Neurological: She is alert and oriented to person, place, and time. She has normal reflexes.  Skin: Skin is warm and dry.  Psychiatric: She has a normal mood and affect. Her behavior is normal. Judgment and thought content normal.    Prenatal labs: ABO, Rh:   Antibody:   Rubella:   RPR:    HBsAg:    HIV:    GBS:     Assessment/Plan: early labor @ 38.3 wks GBS unknown  Care in [place].  Prenatal panel Drug screen  Wyvonnia Dusky 07/27/2017, 1:20 PM

## 2017-07-27 NOTE — Anesthesia Procedure Notes (Signed)
Epidural Patient location during procedure: OB  Staffing Anesthesiologist: Laquandra Carrillo, MD Performed: anesthesiologist   Preanesthetic Checklist Completed: patient identified, site marked, surgical consent, pre-op evaluation, timeout performed, IV checked, risks and benefits discussed and monitors and equipment checked  Epidural Patient position: sitting Prep: DuraPrep Patient monitoring: heart rate, continuous pulse ox and blood pressure Approach: right paramedian Location: L4-L5 Injection technique: LOR saline  Needle:  Needle type: Tuohy  Needle gauge: 17 G Needle length: 9 cm and 9 Needle insertion depth: 5 cm Catheter type: closed end flexible Catheter size: 20 Guage Catheter at skin depth: 9 cm Test dose: negative  Assessment Events: blood not aspirated, injection not painful, no injection resistance, negative IV test and no paresthesia  Additional Notes Patient identified. Risks/Benefits/Options discussed with patient including but not limited to bleeding, infection, nerve damage, paralysis, failed block, incomplete pain control, headache, blood pressure changes, nausea, vomiting, reactions to medication both or allergic, itching and postpartum back pain. Confirmed with bedside nurse the patient's most recent platelet count. Confirmed with patient that they are not currently taking any anticoagulation, have any bleeding history or any family history of bleeding disorders. Patient expressed understanding and wished to proceed. All questions were answered. Sterile technique was used throughout the entire procedure. Please see nursing notes for vital signs. Test dose was given through epidural needle and negative prior to continuing to dose epidural or start infusion. Warning signs of high block given to the patient including shortness of breath, tingling/numbness in hands, complete motor block, or any concerning symptoms with instructions to call for help. Patient was given  instructions on fall risk and not to get out of bed. All questions and concerns addressed with instructions to call with any issues.     

## 2017-07-28 DIAGNOSIS — Z3A38 38 weeks gestation of pregnancy: Secondary | ICD-10-CM | POA: Diagnosis not present

## 2017-07-28 LAB — RPR: RPR: NONREACTIVE

## 2017-07-28 LAB — RUBELLA SCREEN: Rubella: 3.93 index (ref >0.99)

## 2017-07-28 NOTE — Anesthesia Postprocedure Evaluation (Signed)
Anesthesia Post Note  Patient: Tina Thomas  Procedure(s) Performed: AN AD HOC LABOR EPIDURAL     Patient location during evaluation: Mother Baby Anesthesia Type: Epidural Level of consciousness: awake and alert and oriented Pain management: pain level controlled Vital Signs Assessment: post-procedure vital signs reviewed and stable Respiratory status: spontaneous breathing Cardiovascular status: stable Postop Assessment: no headache, no backache, epidural receding, patient able to bend at knees, no apparent nausea or vomiting and adequate PO intake Anesthetic complications: no    Last Vitals:  Vitals:   07/27/17 2130 07/28/17 0130  BP: 134/83 126/73  Pulse: 80 86  Resp: 18 18  Temp: 36.7 C 37.3 C  SpO2:      Last Pain:  Vitals:   07/28/17 0845  TempSrc:   PainSc: 0-No pain   Pain Goal: Patients Stated Pain Goal: 5 (07/27/17 1122)               Tina Thomas

## 2017-07-28 NOTE — Progress Notes (Signed)
MOB was referred for history of depression/anxiety. * Referral screened out by Clinical Social Worker because none of the following criteria appear to apply: ~ History of anxiety/depression during this pregnancy, or of post-partum depression. ~ Diagnosis of anxiety and/or depression within last 3 years OR * MOB's symptoms currently being treated with medication and/or therapy. Please contact the Clinical Social Worker if needs arise, by MOB request, or if MOB scores greater than 9/yes to question 10 on Edinburgh Postpartum Depression Screen.  Kemontae Dunklee Boyd-Gilyard, MSW, LCSW Clinical Social Work [phone] 

## 2017-07-28 NOTE — Progress Notes (Signed)
Post Partum Day 1 Subjective: no complaints, up ad lib, voiding and tolerating PO  Objective: Blood pressure 126/73, pulse 86, temperature 99.1 F (37.3 C), temperature source Oral, resp. rate 18, height 5\' 9"  (1.753 m), weight 71.3 kg (157 lb 1.6 oz), last menstrual period 10/29/2016, SpO2 100 %.  Physical Exam:  General: alert, cooperative and no distress Lochia: appropriate Uterine Fundus: firm DVT Evaluation: No evidence of DVT seen on physical exam.  Recent Labs    07/27/17 1356  HGB 11.4*  HCT 33.2*    Assessment/Plan: Plan for discharge tomorrow and Breastfeeding  Continue care   LOS: 1 day   Caryl AdaJazma Jonisha Kindig, DO 07/28/2017, 9:07 AM

## 2017-07-28 NOTE — Lactation Note (Signed)
This note was copied from a baby's chart. Lactation Consultation Note  Patient Name: Tina Thomas ONGEX'BToday's Date: 07/28/2017 Reason for consult: Follow-up assessment   P1, Baby 18 hours old.  < 6 lbs.  Latch is improving. Mother requested assistance w/ breastfeeding. Baby is tongue thrusting and is having difficulty sustaining latch. Demonstrated tongue thrusting.  Mother hand expressed drops on spoon. Taught mother how to spoon feed. Reviewed breast support with reminders and placing pillow under baby for support. Tried with and without #20NS but mother was able to latch baby on R breast without NS. Baby sustained latch. Sucks and swallows observed.   Demonstrated how to prepump on L side to help evert nipple before latching. RN will set up DEBP..Suggest mother pump q 3 hours. Maternal Data    Feeding Feeding Type: Breast Fed Length of feed: 10 min(off and on with and without NS)  LATCH Score Latch: Repeated attempts needed to sustain latch, nipple held in mouth throughout feeding, stimulation needed to elicit sucking reflex.  Audible Swallowing: A few with stimulation  Type of Nipple: Everted at rest and after stimulation  Comfort (Breast/Nipple): Soft / non-tender  Hold (Positioning): Assistance needed to correctly position infant at breast and maintain latch.  LATCH Score: 7  Interventions    Lactation Tools Discussed/Used Tools: Nipple Shields Nipple shield size: 20   Consult Status Consult Status: Follow-up Date: 07/29/17 Follow-up type: In-patient    [name], [name] 07/28/2017, 2:31 PM

## 2017-07-28 NOTE — Plan of Care (Signed)
Patient progressing appropriately. Ambulating, voiding, providing infant care, and declines pain.

## 2017-07-29 ENCOUNTER — Encounter (HOSPITAL_COMMUNITY): Payer: Self-pay | Admitting: *Deleted

## 2017-07-29 DIAGNOSIS — I10 Essential (primary) hypertension: Secondary | ICD-10-CM | POA: Diagnosis not present

## 2017-07-29 DIAGNOSIS — Z3A38 38 weeks gestation of pregnancy: Secondary | ICD-10-CM | POA: Diagnosis not present

## 2017-07-29 MED ORDER — IBUPROFEN 600 MG PO TABS: 600.0000 mg | ORAL_TABLET | Freq: Four times a day (QID) | ORAL | 0 refills | Status: DC

## 2017-07-29 MED ORDER — AMLODIPINE BESYLATE 5 MG PO TABS
5.0000 mg | ORAL_TABLET | Freq: Every day | ORAL | Status: DC
  Filled 2017-07-29 (×2): qty 1

## 2017-07-29 MED ORDER — SENNOSIDES-DOCUSATE SODIUM 8.6-50 MG PO TABS: 2.0000 | ORAL_TABLET | Freq: Every evening | ORAL | 0 refills | Status: DC | PRN

## 2017-07-29 MED ORDER — PRENATAL MULTIVITAMIN CH: 1.0000 | ORAL_TABLET | Freq: Every day | ORAL | 0 refills | Status: DC

## 2017-07-29 MED ORDER — AMLODIPINE BESYLATE 5 MG PO TABS: 5.0000 mg | ORAL_TABLET | Freq: Every day | ORAL | 0 refills | Status: DC

## 2017-07-29 NOTE — Lactation Note (Addendum)
This note was copied from a baby's chart. Lactation Consultation Note  Patient Name: Girl Donzetta MattersJada Febres ZOXWR'UToday's Date: 07/29/2017 Reason for consult: Follow-up assessment   P1, Baby 40 hours old < 6 lbs. 4.3% weight loss in the last 24 hours. Mother states baby breastfed off and on for 2 hours. Discussed due to size limit feedings to 20 min so baby does not hang out at breast. Mother has pumped x2 and states she is not getting anything. Described how breastmilk comes to volume and the importance of post pumping. Mom has my # to call for assist w/next feeding. Discussed weight loss and the need for supplementation ideally with her breastmilk. Mother exhausted.  Seems unsure if she can follow feeding plan although she really wants to provide her baby with breastmilk. Discussed with MD.  Mother refuses formula.  MD put in order for Donor breastmilk. Reviewed volume guidelines and had mother sign consent. Mother chooses to give baby milk in bottle.  Plan:  (reviewed with mother verbally and hard copy, RN aware) Mother is to breastfeed per feeding cues at least q 3 hours. If baby latches, then mother should breastfeed for 20 min and then give supplemental breastmilk or donor breastmilk. If baby is having difficulty latching, try for 10 min and then give supplemental breastmilk or donor breastmilk. Follow volume guidelines for supplement on LPI information sheet. Increase volume per day of life. Post pump with DEBP after every other feeding for 20 min. Give volume back to baby at next feeding.   Maternal Data    Feeding    LATCH Score                   Interventions    Lactation Tools Discussed/Used     Consult Status Consult Status: Follow-up Date: 07/30/17 Follow-up type: In-patient    [name], Ruth [facility] 07/29/2017, 11:24 AM

## 2017-07-29 NOTE — Discharge Summary (Signed)
OB Discharge Summary     Patient Name: Tina Thomas DOB: Jul 23, 1995 MRN: [id number] Date of admission: 07/27/2017  Delivering MD: Wyvonnia Dusky D   Date of discharge: 07/29/2017   Admitting diagnosis: Spontaneous onset of labor Intrauterine pregnancy: [redacted]w[redacted]d    Secondary diagnosis:  Active Problems:   Patient Active Problem List   Diagnosis Date Noted  . Hypertension 07/29/2017  . Pregnancy 07/27/2017  . Vaginal delivery 07/27/2017    Additional problems: HTN, persistent after delivery     Discharge diagnosis: Term Pregnancy Delivered                                                                                                Post partum procedures: none  Complications: None  Hospital course:  Onset of Labor With Vaginal Delivery     22 y.o. yo G1P0 at [redacted]w[redacted]d was admitted in Active Labor on 07/27/2017. Patient had an uncomplicated labor course as follows:  Membrane Rupture Time/Date: 3:39 PM ,07/27/2017   Intrapartum Procedures: Episiotomy: None [1]                                         Lacerations:  None [1]  Patient had a delivery of a Viable infant. 07/27/2017  Information for the patient's newborn:  Hector, Venne [[id number]]  Delivery Method: Vaginal, Spontaneous(Filed from Delivery Summary)   Pateint had an uncomplicated postpartum course.  She is ambulating, tolerating a regular diet, passing flatus, and urinating well. BPs remained elevated postpartum so patient started on BP mediation. Will have BP check in a week. Patient is discharged home in stable condition on 07/29/17.  Physical exam  Vitals:   07/28/17 1830 07/29/17 0646  BP: 129/68 (!) 143/82  Pulse: 87 75  Resp:  16  Temp:  98.4 F (36.9 C)  SpO2:      General: alert, cooperative and no distress Lochia: appropriate Uterine Fundus: firm Incision: N/A DVT Evaluation: No evidence of DVT seen on physical exam.  Labs:    Component Value Date/Time   WBC 8.2 07/27/2017 1356   RBC 4.30 07/27/2017 1356    HGB 11.4 (L) 07/27/2017 1356   HGB 13.6 12/17/2016   HCT 33.2 (L) 07/27/2017 1356   HCT 41 12/17/2016   PLT 294 07/27/2017 1356   PLT 270 12/17/2016   MCV 77.2 (L) 07/27/2017 1356   MCH 26.5 07/27/2017 1356   MCHC 34.3 07/27/2017 1356   RDW 13.8 07/27/2017 1356   LYMPHSABS 2.1 07/27/2017 1356   MONOABS 0.6 07/27/2017 1356   EOSABS 0.1 07/27/2017 1356   BASOSABS 0.0 07/27/2017 1356    Discharge instruction: per After Visit Summary and "Baby and Me Booklet".  After visit meds:  Allergies  Allergen Reactions  . Compazine [Prochlorperazine] Anxiety    Allergies as of 07/29/2017      Reactions   Compazine [prochlorperazine] Anxiety      Medication List    TAKE these medications   acetaminophen 500 MG tablet Commonly known as:  TYLENOL Take  1,000 mg by mouth every 6 (six) hours as needed for mild pain.   amLODipine 5 MG tablet Commonly known as:  NORVASC Take 1 tablet (5 mg total) by mouth daily.   ferrous sulfate 325 (65 FE) MG tablet Take 325 mg by mouth daily with breakfast.   ibuprofen 600 MG tablet Commonly known as:  ADVIL,MOTRIN Take 1 tablet (600 mg total) by mouth every 6 (six) hours.   prenatal multivitamin Tabs tablet Take 1 tablet by mouth daily at 12 noon.   senna-docusate 8.6-50 MG tablet Commonly known as:  Senokot-S Take 2 tablets by mouth at bedtime as needed for mild constipation.      Diet: routine diet  Activity: Advance as tolerated. Pelvic rest for 6 weeks.   Outpatient follow up:4 weeks Future Appointments: No future appointments.  Follow up Appt:  Patient to follow[place] with OB provider in [place] where she received her care  Postpartum contraception: Depo Provera  Newborn Data: APGAR (1 MIN): 9   APGAR (5 MINS): 9   Weight 5 lb 9.6 oz (2540 g)  Baby Feeding: Breast Disposition:home with mother  Larose HiresChristopher Trennepohl, Medical Student  07/29/2017   OB FELLOW DISCHARGE ATTESTATION  I have seen and examined this patient.  I agree with above documentation and have made edits as needed.   Caryl AdaJazma Timmi Devora, DO OB Fellow

## 2017-07-29 NOTE — Discharge Instructions (Signed)

## 2017-07-30 ENCOUNTER — Ambulatory Visit: Payer: Self-pay

## 2017-07-30 NOTE — Lactation Note (Signed)
This note was copied from a baby's chart. Lactation Consultation Note  Patient Name: Tina Thomas ZOXWR'UToday's Date: 07/30/2017 Reason for consult: Follow-up assessment   P1, < 6 lbs. Baby 74 hours old.  Baby tongue thrusting.  Difficult latch. Mother has been primarily bottle feeding donor milk today. Mother started pumping today more frequently. Recommend she bf first to practice latching. Assisted w/ latching after baby was given 11 ml of mother's breastmilk. Baby would briefly latch and push off on the nipple.  Attempted w/ #20NS prefilled but baby still unlatching.  Encouraged mother to continue to try with each feeding as she latch this morning and last night. Mother recently pumped again and will give both her milk and donor milk to baby. Mother has her own DEBP.      Maternal Data    Feeding Feeding Type: Breast Milk Nipple Type: Slow - flow  LATCH Score                   Interventions    Lactation Tools Discussed/Used     Consult Status      Hardie PulleyBerkelhammer, Chanae Gemma Boschen 07/30/2017, 8:52 PM

## 2017-07-30 NOTE — Lactation Note (Addendum)
This note was copied from a baby's chart. Lactation Consultation Note  Patient Name: Girl Donzetta MattersJada Dever UJWJX'BToday's Date: 07/30/2017 Reason for consult: Follow-up assessment   Baby 71 hours old < 5 lbs.  9.8% weight loss.  2.8% weight loss in 24 hours. Baby is tongue thrusting, keeps tongue on roof of mouth. Mother needs reminders to feed baby and pump. Had her put alarms reminders in her cell phone. When baby latches she pushes off with her tongue.  She did not sustain latch so mother is bottle feeding donor breastmilk. Baby took 29 ml with soft slow flow nipple. Mom encouraged to feed baby 8-12 times/24 hours and with feeding cues at least 2.5 - 3hours. Reviewed volume guidelines and discussed increasing volume guidelines.  Helped mother w/ pumping and she pumped 11 ml which will given at the next feeding. Mom has my # to call for assist w/next feeding.      Maternal Data    Feeding Feeding Type: Donor Breast Milk Nipple Type: Slow - flow  LATCH Score                   Interventions    Lactation Tools Discussed/Used     Consult Status      Hardie PulleyBerkelhammer, Ruth Boschen 07/30/2017, 5:56 PM

## 2017-07-31 ENCOUNTER — Ambulatory Visit: Payer: Self-pay

## 2017-07-31 NOTE — Lactation Note (Signed)
This note was copied from a baby's chart. Lactation Consultation Note  Patient Name: Tina Donzetta MattersJada Rocks VWUJW'JToday's Date: 07/31/2017 Reason for consult: Follow-up assessment;Infant weight loss;Other (Comment);Early term 5137-38.6wks;Infant < 6lbs(gained weight ,, )  Baby is 3889 hour old.  Per mom  has pumped x10 in the last 24, last at 1000, max 60 ml. Per mom nipples are to sore to latch. LC offered to check breast tissue and mom consented.  LC noted positional strips on the the upper edges of both nipples, areolas semi compressible.  Baby awake, mom changed wet diaper, and fed the baby donor milk 40 ml , baby tolerated well.  Sore nipple and engorgement prevention and tx reviewed. Per mom has comfort gels, breast shells.  Coconut oil, and a DEBP Ameda for home use.   LC encouraged mom to try latching the baby once her sore nipples improve.  Since the baby has had bottles, she may need and appetizer 1st 10 ml and then to the breast when she is wide awake and ready to really eat. If the baby does latch feed for 15 -20 mins , 30 mins max and supplement  With at least 30 ml - 45 ml. Post pump both breast for 15 -20 mins to protect milk supply being established.   [facility] Next [facility]. LC placed a request in the basket for the clinic to call mom.   [facility]. LC praised her for her efforts.      Maternal Data Has patient been taught Hand Expression?: Yes  Feeding Feeding Type: Donor Breast Milk Nipple Type: Slow - flow  LATCH Score       Type of Nipple: (LC assessed )           Interventions Interventions: Breast feeding basics reviewed  Lactation Tools Discussed/Used Pump Review: Milk Storage   Consult Status Consult Status: Follow-up Date: ([facility] ) Follow-up type: Out-patient    Tina Thomas 07/31/2017, 12:20 PM

## 2017-09-11 ENCOUNTER — Encounter: Payer: Self-pay | Admitting: Student

## 2017-09-11 ENCOUNTER — Ambulatory Visit (INDEPENDENT_AMBULATORY_CARE_PROVIDER_SITE_OTHER): Payer: Federal, State, Local not specified - PPO | Admitting: Student

## 2017-09-11 DIAGNOSIS — Z1389 Encounter for screening for other disorder: Secondary | ICD-10-CM

## 2017-09-11 NOTE — Progress Notes (Signed)
Most pain at night

## 2017-09-11 NOTE — Patient Instructions (Signed)

## 2017-09-11 NOTE — Progress Notes (Signed)
Subjective:     [name] is a 22 y.o. female who presents for a postpartum visit. She is 6 weeks postpartum following a spontaneous vaginal delivery. I have fully reviewed the prenatal and intrapartum course. The delivery was at 38 gestational weeks. Outcome: spontaneous vaginal delivery. Anesthesia: epidural. Postpartum course has been uneventful. Baby's course has been uneventful. Baby is feeding by both breast and bottle - Similac Advance. Bleeding staining only. Bowel function is normal. Bladder function is normal. Patient is sexually active. Contraception method is none. Postpartum depression screening: negative.  The following portions of the patient's history were reviewed and updated as appropriate: allergies, current medications, past family history, past medical history, past social history, past surgical history and problem list.  Review of Systems Pertinent items are noted in HPI.   Objective:    Ht 5\' 9"  (1.753 m)   Wt 144 lb 3.2 oz (65.4 kg)   Breastfeeding? Yes Comment: Bottle/Breast  BMI 21.29 kg/m   General:  alert, cooperative and no distress   Breasts:  inspection negative, no nipple discharge or bleeding, no masses or nodularity palpable  Lungs: clear to auscultation bilaterally  Heart:  regular rate and rhythm, S1, S2 normal, no murmur, click, rub or gallop  Abdomen: soft, non-tender; bowel sounds normal; no masses,  no organomegaly   Vulva:  not evaluated  Vagina: not evaluated  Cervix:  not evaluated  Corpus: not examined  Adnexa:  not evaluated  Rectal Exam: Not performed.        Assessment:    Normal postpartum exam. Pap smear not done at today's visit.   Plan:    1. Contraception: none 2. PAtient will follow-up with her [facility] for Pap and contraception.  3. Follow up in: as needed  or PRN or as needed.

## 2017-12-02 ENCOUNTER — Encounter: Payer: Self-pay | Admitting: Obstetrics and Gynecology

## 2017-12-02 ENCOUNTER — Ambulatory Visit (INDEPENDENT_AMBULATORY_CARE_PROVIDER_SITE_OTHER): Payer: Federal, State, Local not specified - PPO | Admitting: Obstetrics and Gynecology

## 2017-12-02 VITALS — BP 113/74 | HR 73 | Wt 134.1 lb

## 2017-12-02 DIAGNOSIS — N3000 Acute cystitis without hematuria: Secondary | ICD-10-CM

## 2017-12-02 DIAGNOSIS — Z01419 Encounter for gynecological examination (general) (routine) without abnormal findings: Secondary | ICD-10-CM | POA: Diagnosis not present

## 2017-12-02 DIAGNOSIS — Z124 Encounter for screening for malignant neoplasm of cervix: Secondary | ICD-10-CM | POA: Diagnosis not present

## 2017-12-02 DIAGNOSIS — Z113 Encounter for screening for infections with a predominantly sexual mode of transmission: Secondary | ICD-10-CM | POA: Diagnosis not present

## 2017-12-02 LAB — POCT URINALYSIS DIP (DEVICE)
BILIRUBIN URINE: NEGATIVE
GLUCOSE, UA: NEGATIVE mg/dL
Hgb urine dipstick: NEGATIVE
KETONES UR: NEGATIVE mg/dL
Nitrite: POSITIVE — AB
PROTEIN: NEGATIVE mg/dL
Specific Gravity, Urine: 1.015 (ref 1.005–1.030)
Urobilinogen, UA: 0.2 mg/dL (ref 0.0–1.0)
pH: 7 (ref 5.0–8.0)

## 2017-12-02 MED ORDER — CEPHALEXIN 500 MG PO CAPS: 500.0000 mg | ORAL_CAPSULE | Freq: Two times a day (BID) | ORAL | 0 refills | Status: AC

## 2017-12-02 MED ORDER — NORETHINDRONE 0.35 MG PO TABS: 1.0000 | ORAL_TABLET | Freq: Every day | ORAL | 11 refills | Status: DC

## 2017-12-02 NOTE — Patient Instructions (Signed)

## 2017-12-02 NOTE — Progress Notes (Signed)
GYNECOLOGY OFFICE VISIT NOTE  History:  22 y.o. G1P1 here today for annual exam. Patient states she is 4 months postpartum. Continues to breast feed. Not currently on contraception; desires to get pregnant again. Concerned that she may have a UTI. Has h/o recurrent UTIs and kidney infections. Denies dysuria, abdominal or back pain. Endorsing dark smelly urine. Also having vaginal discharge. Wants to be STD tested. She denies any abnormal vaginal bleeding, pelvic pain, fever, or other concerns.   Past Medical History:  Diagnosis Date  . Sickle cell trait (HCC)   . UTI (urinary tract infection)     Past Surgical History:  Procedure Laterality Date  . NO PAST SURGERIES      The following portions of the patient's history were reviewed and updated as appropriate: allergies, current medications, past family history, past medical history, past social history, past surgical history and problem list.   Health Maintenance:  Pap due.   Review of Systems:  Pertinent items noted in HPI.   Objective:  Physical Exam BP 113/74   Pulse 73   Wt 134 lb 1.6 oz (60.8 kg)   LMP 10/28/2017 (Approximate)   Breastfeeding? Yes   BMI 19.80 kg/m  CONSTITUTIONAL: Well-developed, well-nourished female in no acute distress.  HENT:  Normocephalic, atraumatic. External right and left ear normal. Oropharynx is clear and moist EYES: Conjunctivae and EOM are normal. Pupils are equal, round, and reactive to light. No scleral icterus.  NECK: Normal range of motion, supple, no masses SKIN: Skin is warm and dry. No rash noted. Not diaphoretic. No erythema. No pallor. NEUROLOGIC: Alert and oriented to person, place, and time. Normal reflexes, muscle tone coordination. No cranial nerve deficit noted. PSYCHIATRIC: Normal mood and affect. Normal behavior. Normal judgment and thought content. CARDIOVASCULAR: Normal heart rate noted RESPIRATORY: Effort and breath sounds normal, no problems with respiration  noted ABDOMEN: Soft, no distention noted. Non-tender.   PELVIC: Normal appearing external genitalia; normal appearing vaginal mucosa and cervix.  No abnormal discharge noted.  Normal uterine size, no other palpable masses, no uterine or adnexal tenderness. MUSCULOSKELETAL: Normal range of motion. No edema noted.  Labs and Imaging Results for orders placed or performed in visit on 12/02/17  POCT urinalysis dip (device)  Result Value Ref Range   Glucose, UA NEGATIVE NEGATIVE mg/dL   Bilirubin Urine NEGATIVE NEGATIVE   Ketones, ur NEGATIVE NEGATIVE mg/dL   Specific Gravity, Urine 1.015 1.005 - 1.030   Hgb urine dipstick NEGATIVE NEGATIVE   pH 7.0 5.0 - 8.0   Protein, ur NEGATIVE NEGATIVE mg/dL   Urobilinogen, UA 0.2 0.0 - 1.0 mg/dL   Nitrite POSITIVE (A) NEGATIVE   Leukocytes, UA SMALL (A) NEGATIVE     Assessment & Plan:  1. Well woman exam with routine gynecological exam Pap smear performed. Counseled extensively on birth control. Patient willing to take pills. POPs prescribed since patient is breastfeeding. STD testing ordered.  - RPR - HIV antibody (with reflex) - Hepatitis C Antibody - Hepatitis B Surface AntiGEN - Cytology - PAP  2. Acute cystitis without hematuria Will prophylaxis treat for UTI. UA somewhat concerning. Urine culture sent. Rx for Kelfex.  - Urine Culture  Routine preventative health maintenance measures emphasized. Please refer to After Visit Summary for other counseling recommendations.   No follow-ups on file.   Total face-to-face time with patient: 25 minutes. Over 50% of encounter was spent on counseling and coordination of care.  Caryl Ada, DO [facility],  [facility]

## 2017-12-03 ENCOUNTER — Encounter: Payer: Self-pay | Admitting: Podiatry

## 2017-12-03 ENCOUNTER — Ambulatory Visit (INDEPENDENT_AMBULATORY_CARE_PROVIDER_SITE_OTHER): Payer: Federal, State, Local not specified - PPO

## 2017-12-03 ENCOUNTER — Ambulatory Visit (INDEPENDENT_AMBULATORY_CARE_PROVIDER_SITE_OTHER): Payer: Federal, State, Local not specified - PPO | Admitting: Podiatry

## 2017-12-03 ENCOUNTER — Other Ambulatory Visit: Payer: Self-pay | Admitting: Podiatry

## 2017-12-03 VITALS — BP 112/77 | HR 96 | Resp 16

## 2017-12-03 DIAGNOSIS — M7750 Other enthesopathy of unspecified foot: Secondary | ICD-10-CM

## 2017-12-03 DIAGNOSIS — M21611 Bunion of right foot: Secondary | ICD-10-CM | POA: Diagnosis not present

## 2017-12-03 DIAGNOSIS — M2010 Hallux valgus (acquired), unspecified foot: Secondary | ICD-10-CM | POA: Diagnosis not present

## 2017-12-03 DIAGNOSIS — M2012 Hallux valgus (acquired), left foot: Principal | ICD-10-CM

## 2017-12-03 DIAGNOSIS — M779 Enthesopathy, unspecified: Secondary | ICD-10-CM

## 2017-12-03 DIAGNOSIS — M2011 Hallux valgus (acquired), right foot: Secondary | ICD-10-CM

## 2017-12-03 LAB — RPR: RPR Ser Ql: NONREACTIVE

## 2017-12-03 LAB — HEPATITIS B SURFACE ANTIGEN: Hepatitis B Surface Ag: NEGATIVE

## 2017-12-03 LAB — HEPATITIS C ANTIBODY: Hep C Virus Ab: <0.1 s/co ratio (ref 0.0–0.9)

## 2017-12-03 LAB — HIV ANTIBODY (ROUTINE TESTING W REFLEX): HIV Screen 4th Generation wRfx: NONREACTIVE

## 2017-12-03 NOTE — Patient Instructions (Addendum)
Bunion A bunion is a bump on the base of the big toe that forms when the bones of the big toe joint move out of position. Bunions may be small at first, but they often get larger over time. The can make walking painful. What are the causes? A bunion may be caused by:  Wearing narrow or pointed shoes that force the big toe to press against the other toes.  Abnormal foot development that causes the foot to roll inward (pronate).  Changes in the foot that are caused by certain diseases, such as rheumatoid arthritis and polio.  A foot injury.  What increases the risk? The following factors may make you more likely to develop this condition:  Wearing shoes that squeeze the toes together.  Having certain diseases, such as: ? Rheumatoid arthritis. ? Polio. ? Cerebral palsy.  Having family members who have bunions.  Being born with a foot deformity, such as flat feet or low arches.  Doing activities that put a lot of pressure on the feet, such as ballet dancing.  What are the signs or symptoms? The main symptom of a bunion is a noticeable bump on the big toe. Other symptoms may include:  Pain.  Swelling around the big toe.  Redness and inflammation.  Thick or hardened skin on the big toe or between the toes.  Stiffness or loss of motion in the big toe.  Trouble with walking.  How is this diagnosed? A bunion may be diagnosed based on your symptoms, medical history, and activities. You may have tests, such as:  X-rays. These allow your health care provider to check the position of the bones in your foot and look for damage to your joint. They also help your health care provider to determine the severity of your bunion and the best way to treat it.  Joint aspiration. In this test, a sample of fluid is removed from the toe joint. This test, which may be done if you are in a lot of pain, helps to rule out diseases that cause painful swelling of the joints, such as  arthritis.  How is this treated? There is no cure for a bunion, but treatment can help to prevent a bunion from getting worse. Treatment depends on the severity of your symptoms. Your health care provider may recommend:  Wearing shoes that have a wide toe box.  Using bunion pads to cushion the affected area.  Taping your toes together to keep them in a normal position.  Placing a device inside your shoe (orthotics) to help reduce pressure on your toe joint.  Taking medicine to ease pain, inflammation, and swelling.  Applying heat or ice to the affected area.  Doing stretching exercises.  Surgery to remove scar tissue and move the toes back into their normal position. This treatment is rare.  Follow these instructions at home:  Support your toe joint with proper footwear, shoe padding, or taping as told by your health care provider.  Take over-the-counter and prescription medicines only as told by your health care provider.  If directed, apply ice to the injured area: ? Put ice in a plastic bag. ? Place a towel between your skin and the bag. ? Leave the ice on for 20 minutes, 2-3 times per day.  If directed, apply heat to the affected area before you exercise. Use the heat source that your health care provider recommends, such as a moist heat pack or a heating pad. ? Place a towel between your   skin and the heat source. ? Leave the heat on for 20-30 minutes. ? Remove the heat if your skin turns bright red. This is especially important if you are unable to feel pain, heat, or cold. You may have a greater risk of getting burned.  Do exercises as told by your health care provider.  Keep all follow-up visits as told by your health care provider. Contact a health care provider if:  Your symptoms get worse.  Your symptoms do not improve in 2 weeks. Get help right away if:  You have severe pain and trouble with walking. This information is not intended to replace advice given  to you by your health care provider. Make sure you discuss any questions you have with your health care provider. Document Released: 07/08/2005 Document Revised: 12/14/2015 Document Reviewed: 02/05/2015 Elsevier Interactive Patient Education  2018 [employer]  Pre-Operative Instructions  Congratulations, you have decided to take an important step towards improving your quality of life.  You can be assured that the doctors and staff at [facility] will be with you every step of the way.  Here are some important things you should know:  1. Plan to be at the surgery center/hospital at least 1 (one) hour prior to your scheduled time, unless otherwise directed by the surgical center/hospital staff.  You must have a responsible adult accompany you, remain during the surgery and drive you home.  Make sure you have directions to the surgical center/hospital to ensure you arrive on time. 2. If you are having surgery at [facility] or [facility] hospitals, you will need a copy of your medical history and physical form from your family physician within one month prior to the date of surgery. We will give you a form for your primary physician to complete.  3. We make every effort to accommodate the date you request for surgery.  However, there are times where surgery dates or times have to be moved.  We will contact you as soon as possible if a change in schedule is required.   4. No aspirin/ibuprofen for one week before surgery.  If you are on aspirin, any non-steroidal anti-inflammatory medications (Mobic, Aleve, Ibuprofen) should not be taken seven (7) days prior to your surgery.  You make take Tylenol for pain prior to surgery.  5. Medications - If you are taking daily heart and blood pressure medications, seizure, reflux, allergy, asthma, anxiety, pain or diabetes medications, make sure you notify the surgery center/hospital before the day of surgery so they can tell you which medications you should  take or avoid the day of surgery. 6. No food or drink after midnight the night before surgery unless directed otherwise by surgical center/hospital staff. 7. No alcoholic beverages 24-hours prior to surgery.  No smoking 24-hours prior or 24-hours after surgery. 8. Wear loose pants or shorts. They should be loose enough to fit over bandages, boots, and casts. 9. Don't wear slip-on shoes. Sneakers are preferred. 10. Bring your boot with you to the surgery center/hospital.  Also bring crutches or a walker if your physician has prescribed it for you.  If you do not have this equipment, it will be provided for you after surgery. 11. If you have not been contacted by the surgery center/hospital by the day before your surgery, call to confirm the date and time of your surgery. 12. Leave-time from work may vary depending on the type of surgery you have.  Appropriate arrangements should be made prior   to surgery with your employer. 13. Prescriptions will be provided immediately following surgery by your doctor.  Fill these as soon as possible after surgery and take the medication as directed. Pain medications will not be refilled on weekends and must be approved by the doctor. 14. Remove nail polish on the operative foot and avoid getting pedicures prior to surgery. 15. Wash the night before surgery.  The night before surgery wash the foot and leg well with water and the antibacterial soap provided. Be sure to pay special attention to beneath the toenails and in between the toes.  Wash for at least three (3) minutes. Rinse thoroughly with water and dry well with a towel.  Perform this wash unless told not to do so by your physician.  Enclosed: 1 Ice pack (please put in freezer the night before surgery)   1 Hibiclens skin cleaner   Pre-op instructions  If you have any questions regarding the instructions, please do not hesitate to call our office.  [place]: [address], [place], [place] [address] --  [phone]  [place]: [address] [place], [place] [address] -- [phone]  [place]: [address]  , [place] [address] -- [phone]  [place]: [address], [address], [place], [place] [address] -- [phone]  Website: [email or web address]  

## 2017-12-03 NOTE — Progress Notes (Signed)
   Subjective:    Patient ID: Tina Thomas, female    DOB: 12-06-1995, 22 y.o.   MRN: [id number]  HPI    Review of Systems  All other systems reviewed and are negative.      Objective:   Physical Exam        Assessment & Plan:

## 2017-12-03 NOTE — Progress Notes (Signed)
Subjective:   Patient ID: Tina Thomas, female   DOB: 22 y.o.   MRN: [id number]   HPI Patient presents with chronic bunion deformity bilateral and states that she is tried wider shoes she is tried padding and other modalities without relief and the gradually becoming more painful for her and patient does not smoke and likes to be active   Review of Systems  All other systems reviewed and are negative.       Objective:  Physical Exam  Constitutional: She appears well-developed and well-nourished.  Cardiovascular: Intact distal pulses.  Pulmonary/Chest: Effort normal.  Musculoskeletal: Normal range of motion.  Neurological: She is alert.  Skin: Skin is warm.  Nursing note and vitals reviewed.   Neurovascular status intact muscle strength is adequate range of motion within normal limits with patient noted to have large hyperostosis medial aspect first metatarsal head right and left with redness noted around the first metatarsal and deviation of the hallux against the second toe bilateral with pain between the hallux and second toe.  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  Patient is very aware of this as her sister and her mother had the same procedure and wants it done and needs to get it done soon due to her work schedule and wants to go over consent form today.  At this point I allowed her to review consent form going over alternative treatments complications and the fact total recovery will take 6 months to 1 year.  Patient signed consent forms given all preoperative instructions and I dispensed air fracture walker today and I want to get used to it prior to surgery and she will use it during the postoperative.  Patient scheduled for outpatient surgery and is  encouraged to call with questions  X-ray indicates there is elevation of the intermetatarsal angle bilateral with hallux interphalangeus noted bilateral

## 2017-12-04 LAB — CYTOLOGY - PAP
CHLAMYDIA, DNA PROBE: NEGATIVE
DIAGNOSIS: NEGATIVE
Neisseria Gonorrhea: NEGATIVE

## 2017-12-04 LAB — URINE CULTURE

## 2017-12-22 ENCOUNTER — Telehealth: Payer: Self-pay | Admitting: *Deleted

## 2017-12-22 NOTE — Telephone Encounter (Signed)
Patient's post op appointment was rescheduled to reflect her new surgery date.

## 2017-12-22 NOTE — Telephone Encounter (Addendum)
"  I am scheduled for surgery on June 11.  I need to reschedule it to August.  My job said that would be the best time for me to be off."  Dr. Charlsie Merlesegal can do it on July 30 or August 13.  "Let's schedule it for July 30.  That should be a good date.  What time will I need to be there?"  Someone from the surgical center will give you a call a day or two prior to your surgery date.  They will give you your arrival time.  "Okay, thank you."  I called and informed Aram BeechamCynthia at the surgical center to move the surgery from 12/30/2017 to 02/17/2018.

## 2018-01-07 ENCOUNTER — Other Ambulatory Visit: Payer: Federal, State, Local not specified - PPO

## 2018-01-18 ENCOUNTER — Encounter (HOSPITAL_COMMUNITY): Payer: Self-pay

## 2018-01-18 ENCOUNTER — Emergency Department (HOSPITAL_COMMUNITY)
Admission: EM | Admit: 2018-01-18 | Discharge: 2018-01-18 | Disposition: A | Discharge status: Home / Self Care | Payer: Federal, State, Local not specified - PPO | Attending: Emergency Medicine | Admitting: Emergency Medicine

## 2018-01-18 DIAGNOSIS — Z87891 Personal history of nicotine dependence: Secondary | ICD-10-CM | POA: Insufficient documentation

## 2018-01-18 DIAGNOSIS — R55 Syncope and collapse: Secondary | ICD-10-CM

## 2018-01-18 DIAGNOSIS — D509 Iron deficiency anemia, unspecified: Secondary | ICD-10-CM | POA: Insufficient documentation

## 2018-01-18 DIAGNOSIS — D573 Sickle-cell trait: Secondary | ICD-10-CM | POA: Diagnosis not present

## 2018-01-18 DIAGNOSIS — R069 Unspecified abnormalities of breathing: Secondary | ICD-10-CM | POA: Diagnosis not present

## 2018-01-18 DIAGNOSIS — I10 Essential (primary) hypertension: Secondary | ICD-10-CM | POA: Diagnosis not present

## 2018-01-18 DIAGNOSIS — Z79899 Other long term (current) drug therapy: Secondary | ICD-10-CM | POA: Insufficient documentation

## 2018-01-18 DIAGNOSIS — T679XXA Effect of heat and light, unspecified, initial encounter: Secondary | ICD-10-CM | POA: Diagnosis not present

## 2018-01-18 DIAGNOSIS — R4020 Unspecified coma: Secondary | ICD-10-CM | POA: Diagnosis not present

## 2018-01-18 LAB — BASIC METABOLIC PANEL
Anion gap: 8 (ref 5–15)
BUN: 11 mg/dL (ref 6–20)
CALCIUM: 9.5 mg/dL (ref 8.9–10.3)
CO2: 26 mmol/L (ref 22–32)
CREATININE: 0.91 mg/dL (ref 0.44–1.00)
Chloride: 104 mmol/L (ref 98–111)
GFR calc non Af Amer: >60 mL/min (ref >60)
GLUCOSE: 103 mg/dL — AB (ref 70–99)
Potassium: 3.8 mmol/L (ref 3.5–5.1)
Sodium: 138 mmol/L (ref 135–145)

## 2018-01-18 LAB — CBC
HCT: 34.4 % — ABNORMAL LOW (ref 36.0–46.0)
Hemoglobin: 10.7 g/dL — ABNORMAL LOW (ref 12.0–15.0)
MCH: 23.5 pg — AB (ref 26.0–34.0)
MCHC: 31.1 g/dL (ref 30.0–36.0)
MCV: 75.4 fL — ABNORMAL LOW (ref 78.0–100.0)
PLATELETS: 342 10*3/uL (ref 150–400)
RBC: 4.56 MIL/uL (ref 3.87–5.11)
RDW: 16.8 % — ABNORMAL HIGH (ref 11.5–15.5)
WBC: 3.8 10*3/uL — ABNORMAL LOW (ref 4.0–10.5)

## 2018-01-18 LAB — I-STAT BETA HCG BLOOD, ED (MC, WL, AP ONLY)

## 2018-01-18 NOTE — ED Provider Notes (Signed)
[facility] EMERGENCY DEPARTMENT Provider Note   CSN: [id number] Arrival date & time: 01/18/18  1238     History   Chief Complaint Chief Complaint  Patient presents with  . Panic Attack  . Loss of Consciousness    HPI Tina Thomas is a 22 y.o. female.  HPI  Patient is a 22 year old female with a history of sickle cell trait presenting for syncopal episode.  Patient reports that she was standing all morning at work where she works at [employer] when she felt a sensation of "hot flash", feeling that her head was underwater, and blurred vision and reports the next thing she knew she was being held on the ground by a Animator.  Patient denies falling to the ground, reports she was caught by a colleague in time.  Patient  reports that after she fell she had a "panic attack" that felt like prior episodes of panic attacks of heavy breathing, and rapid heart rate.  Patient reports that this subsided after a couple minutes, and at present she feels completely back to baseline.  Patient denies any precipitating chest pain, shortness of breath.  Patient denies any chest pain, shortness breath, dizziness or lightheadedness, palpitations at present.  Patient denies any recent illnesses.  Patient reports that her urine was "dark yellow" today, but she was drinking water.  Patient does report eating breakfast this morning.  Patient denies history of syncope.  No family history of sudden cardiac death.  Patient denies any immobilization, hospitalization, estrogen use, recent surgery, history of cancer treatment, history DVT/PE, lower extremity edema, swelling, or pain.  Patient did deliver a child 5 months ago.   Past Medical History:  Diagnosis Date  . Sickle cell trait (HCC)   . UTI (urinary tract infection)     Patient Active Problem List   Diagnosis Date Noted  . Hypertension 07/29/2017  . Pregnancy 07/27/2017  . Vaginal delivery 07/27/2017    Past Surgical History:    Procedure Laterality Date  . NO PAST SURGERIES       OB History    Gravida  1   Para      Term      Preterm      AB      Living        SAB      TAB      Ectopic      Multiple      Live Births               Home Medications    Prior to Admission medications   Medication Sig Start Date End Date Taking? Authorizing Provider  acetaminophen (TYLENOL) 500 MG tablet Take 1,000 mg by mouth every 6 (six) hours as needed for mild pain.    [provider]  ibuprofen (ADVIL,MOTRIN) 600 MG tablet Take 1 tablet (600 mg total) by mouth every 6 (six) hours. 07/29/17   Pincus Large, DO  norethindrone (MICRONOR,CAMILA,ERRIN) 0.35 MG tablet Take 1 tablet (0.35 mg total) by mouth daily. 12/02/17   Pincus Large, DO  Prenatal Vit-Fe Fumarate-FA (PRENATAL MULTIVITAMIN) TABS tablet Take 1 tablet by mouth daily at 12 noon. 07/29/17   Pincus Large, DO    Family History Family History  Problem Relation Age of Onset  . Hypertension Mother   . Diabetes Sister   . Cancer Maternal Grandmother     Social History Social History   Tobacco Use  . Smoking status: Former Games developer  .  Smokeless tobacco: Never Used  Substance Use Topics  . Alcohol use: Yes    Comment: social   . Drug use: No     Allergies   Compazine [prochlorperazine]   Review of Systems Review of Systems  Constitutional: Negative for chills and fever.  HENT: Negative for congestion and rhinorrhea.   Eyes: Negative for visual disturbance.  Respiratory: Negative for chest tightness and shortness of breath.   Cardiovascular: Negative for chest pain, palpitations and leg swelling.  Gastrointestinal: Negative for nausea and vomiting.  Skin: Negative for rash.  Neurological: Positive for syncope and light-headedness.  All other systems reviewed and are negative.    Physical Exam Updated Vital Signs BP 124/81 (BP Location: Right Arm)   Pulse 66   Temp 97.7 F (36.5 C) (Oral)   Resp 17   SpO2  100%   Physical Exam  Constitutional: She is oriented to person, place, and time. She appears well-developed and well-nourished. No distress.  HENT:  Head: Normocephalic and atraumatic.  Mouth/Throat: Oropharynx is clear and moist.  Eyes: Pupils are equal, round, and reactive to light. Conjunctivae and EOM are normal.  Neck: Normal range of motion. Neck supple.  Cardiovascular: Normal rate, regular rhythm, S1 normal and S2 normal.  No murmur heard. Pulmonary/Chest: Effort normal and breath sounds normal. She has no wheezes. She has no rales.  Abdominal: Soft. She exhibits no distension. There is no tenderness. There is no guarding.  Musculoskeletal: Normal range of motion. She exhibits no edema or deformity.  Lymphadenopathy:    She has no cervical adenopathy.  Neurological: She is alert and oriented to person, place, and time.  Cranial nerves grossly intact. Strength 5 out of 5 in upper lower extremities. Romberg normal.  Normal and symmetric gait.  Skin: Skin is warm and dry. No rash noted. No erythema.  Psychiatric: She has a normal mood and affect. Her behavior is normal. Judgment and thought content normal.  Nursing note and vitals reviewed.    ED Treatments / Results  Labs (all labs ordered are listed, but only abnormal results are displayed) Labs Reviewed  BASIC METABOLIC PANEL - Abnormal; Notable for the following components:      Result Value   Glucose, Bld 103 (*)    All other components within normal limits  CBC - Abnormal; Notable for the following components:   WBC 3.8 (*)    Hemoglobin 10.7 (*)    HCT 34.4 (*)    MCV 75.4 (*)    MCH 23.5 (*)    RDW 16.8 (*)    All other components within normal limits  I-STAT BETA HCG BLOOD, ED ([facility], [facility], [facility] ONLY)    EKG EKG Interpretation  Date/Time:  Sunday January 18 2018 12:43:43 EDT Ventricular Rate:  90 PR Interval:  132 QRS Duration: 86 QT Interval:  342 QTC Calculation: 418 R Axis:   86 Text Interpretation:   Normal sinus rhythm Normal ECG Confirmed by Bethann Berkshire [phone]) on 01/18/2018 4:22:22 PM   Radiology No results found.  Procedures Procedures (including critical care time)  Medications Ordered in ED Medications - No data to display   Initial Impression / Assessment and Plan / ED Course  I have reviewed the triage vital signs and the nursing notes.  Pertinent labs & imaging results that were available during my care of the patient were reviewed by me and considered in my medical decision making (see chart for details).     DDx includes: Orthostatic hypotension Stroke Vertebral  artery dissection/stenosis Dysrhythmia PE Vasovagal/neurocardiogenic syncope Aortic stenosis Valvular disorder/Cardiomyopathy Anemia  Patient without arrhythmia or tachycardia while here in the department.  Patient without history of cardiomyopathy or sudden cardiac death.  Hemoglobin 10.7 with hct of 34.4 and pt has hx of anemia, normal ECG, no shortness of breath and systolic blood pressure greater than 90; patient is low risk. Patient has Wells' score 0 and is PERC negative and completely asymptomatic at present, therefore doubt PE. Will plan for discharge home with close cardiology/PCP follow-up.  Patient's motheh rhas primary cardiologist that she would like to take her daughter to. Possibility of recurrent syncope has been discussed. I discussed refraining from driving until cardiology/PCP followup and other safety prevention.  Pt has remained hemodynamically stable throughout their time in the ED  BP 123/84   Pulse 68   Temp 97.7 F (36.5 C) (Oral)   Resp 18   SpO2 100%    Final Clinical Impressions(s) / ED Diagnoses   Final diagnoses:  Syncope and collapse  Microcytic anemia    ED Discharge Orders    None       Delia ChimesMurray, Deisi Salonga B, PA-C 01/18/18 2342    Bethann BerkshireZammit, Joseph, MD 01/22/18 [phone]

## 2018-01-18 NOTE — ED Notes (Signed)
Patient given discharge instructions and verbalized understanding.  Patient stable to discharge at this time.  Patient is alert and oriented to baseline.  No distressed noted at this time.  All belongings taken with the patient at discharge.   

## 2018-01-18 NOTE — ED Triage Notes (Signed)
Pt presents for panic attack. Hx of one other panic attack once postpartum. Reports had syncopal event x 1 minute today after panic attack. No meds PTA.

## 2018-01-18 NOTE — Discharge Instructions (Signed)
Please see the information and instructions below regarding your visit.  Your diagnoses today include:  1. Syncope and collapse     Tests performed today include: See side panel of your discharge paperwork for testing performed today. Vital signs are listed at the bottom of these instructions.   Medications prescribed:    Take any prescribed medications only as prescribed, and any over the counter medications only as directed on the packaging.  Home care instructions:  Please follow any educational materials contained in this packet.   Follow-up instructions: Please follow-up with your primary care provider for further evaluation of your symptoms if they are not completely improved.   Please follow up with Cardiology.  Return instructions:  Please return to the Emergency Department if you experience worsening symptoms.  Please return the emergency department for any chest pain, shortness of breath, or further episodes of passing out that occur when you are exerting yourself. Please return if you have any other emergent concerns.  Additional Information: As we discussed today, it is advised that you avoid long car rides, or driving with others in the car until we complete the work-up of your passing out.  You are not required to stop driving prolonged, however it is advised that you be evaluated by primary care and cardiology prior to resuming driving.  Your vital signs today were: BP 124/81 (BP Location: Right Arm)    Pulse 66    Temp 97.7 F (36.5 C) (Oral)    Resp 17    SpO2 100%  If your blood pressure (BP) was elevated on multiple readings during this visit above 130 for the top number or above 80 for the bottom number, please have this repeated by your primary care provider within one month. --------------  Thank you for allowing us to participate in your care today.

## 2018-02-12 ENCOUNTER — Telehealth: Payer: Self-pay | Admitting: *Deleted

## 2018-02-12 NOTE — Telephone Encounter (Signed)
"  If someone could give me a call back in regards to a surgical procedure which is for the 30th of July.  I have a few questions.  I need to push my surgery back further due to me not having the appropriate support system for me and my daughter.

## 2018-02-13 NOTE — Telephone Encounter (Signed)
I have rescheduled pt's post op visit to Wednesday 11 September at 2:15 pm.

## 2018-02-13 NOTE — Telephone Encounter (Signed)
"  I need to reschedule my surgery.  I don't have any support system right now, I have a daughter that I have to look out for. I'm scheduled for July 30."  Do you know when you would like to reschedule it to?  "I'd like to do it the first week of September if possible.  That will give me enough time to make arrangements."  I'll get it rescheduled to September 3.  I'll let the surgical center know as well as Dr. Charlsie Merlesegal.

## 2018-02-25 ENCOUNTER — Other Ambulatory Visit: Payer: Federal, State, Local not specified - PPO

## 2018-03-06 ENCOUNTER — Telehealth: Payer: Self-pay | Admitting: *Deleted

## 2018-03-06 NOTE — Telephone Encounter (Signed)
"  I'm calling to get the date of my surgery.  I know it's scheduled for this month."  We have you scheduled for March 24, 2018.  "Okay, thank you."

## 2018-03-24 ENCOUNTER — Encounter: Payer: Self-pay | Admitting: Podiatry

## 2018-03-24 DIAGNOSIS — M25571 Pain in right ankle and joints of right foot: Secondary | ICD-10-CM | POA: Diagnosis not present

## 2018-03-24 DIAGNOSIS — M2011 Hallux valgus (acquired), right foot: Secondary | ICD-10-CM | POA: Diagnosis not present

## 2018-03-24 DIAGNOSIS — M2041 Other hammer toe(s) (acquired), right foot: Secondary | ICD-10-CM | POA: Diagnosis not present

## 2018-03-24 DIAGNOSIS — M21611 Bunion of right foot: Secondary | ICD-10-CM | POA: Diagnosis not present

## 2018-04-01 ENCOUNTER — Ambulatory Visit (INDEPENDENT_AMBULATORY_CARE_PROVIDER_SITE_OTHER): Payer: Federal, State, Local not specified - PPO

## 2018-04-01 ENCOUNTER — Ambulatory Visit (INDEPENDENT_AMBULATORY_CARE_PROVIDER_SITE_OTHER): Payer: Federal, State, Local not specified - PPO | Admitting: Podiatry

## 2018-04-01 VITALS — BP 125/78 | HR 78 | Temp 98.3°F

## 2018-04-01 DIAGNOSIS — M21611 Bunion of right foot: Secondary | ICD-10-CM | POA: Diagnosis not present

## 2018-04-01 DIAGNOSIS — M2010 Hallux valgus (acquired), unspecified foot: Secondary | ICD-10-CM

## 2018-04-01 MED ORDER — OXYCODONE-ACETAMINOPHEN 10-325 MG PO TABS: 1.0000 | ORAL_TABLET | Freq: Three times a day (TID) | ORAL | 0 refills | Status: DC | PRN

## 2018-04-01 MED ORDER — ONDANSETRON HCL 4 MG PO TABS: 4.0000 mg | ORAL_TABLET | Freq: Three times a day (TID) | ORAL | 0 refills | Status: DC | PRN

## 2018-04-02 NOTE — Progress Notes (Signed)
Subjective:   Patient ID: Tina MattersJada Pletz, female   DOB: 22 y.o.   MRN: [id number]   HPI Patient presents stating she is doing really well states she is been on her foot as she is had to take care of her 3460-month-old baby and she is doing the best that she can and is very satisfied with her surgery   ROS      Objective:  Physical Exam  Neurovascular status intact with patient's right foot healing very well with the hallux medial rectus although did well coapted and structural bunion fully corrected     Assessment:  Doing really well post osteotomy right first metatarsal right hallux     Plan:  Reviewed x-ray and recommended continued elevation compression immobilization and reappoint in 3 weeks or earlier if needed  X-rays indicate osteotomy is healing well with fixation in place and hallux healing well with fixation in place with good alignment noted

## 2018-04-10 ENCOUNTER — Telehealth: Payer: Self-pay | Admitting: Sports Medicine

## 2018-04-10 NOTE — Telephone Encounter (Signed)
Patient called answering service stating that she has been up on her foot more caring for her 98 month old and hasn't been resting, icing, or elevating since her last office visit she was told that she can walk with postop shoe and states that since being in the shoes she feels like her foot has turned and that the bunion deformity has come back has noticed that she has a lot of swelling some redness and warmth to the surgical site and reports that there is some pain however she is mostly concerned about the deformity coming back and states that she knows she has not been able to be off her foot or elevate or ice much because of taking care of her baby.  I advised patient to return to nonweightbearing and using her cam boot and to go to her nearest urgent care or emergency room since she is currently in [place] for an evaluation of the surgical foot.  Patient expressed understanding I also advised patient to rest ice elevate and take her pain medicines as prescribed and to call office for follow-up on Monday.  I advised patient if the ER physician over urgent care doctor needs to speak with me that they can call our on-call service and I will be more than happy to discuss her case and any plans of care with them.  Patient reports that she will send me some photos of how her foot looks however at this time of dictation patient has failed to send me any photos in order to document how her foot is currently looking.  -Dr. Marylene LandStover

## 2018-04-11 NOTE — Telephone Encounter (Signed)
Patient sent pictures today of her foot:  Preop   First post op visit   Foot yesterday when on way to ER/Urgent Care   Foot today after elevating, icing, and wearing ACE wrap    Advised patient that sometimes swelling can cause pressure to joint/buldging. Advised to continue with rest, ice, elevation, CAM boot/Crutches and to make follow up with Dr. Charlsie Merlesegal this coming week to further discuss. -Dr. Marylene LandStover

## 2018-04-15 ENCOUNTER — Ambulatory Visit (INDEPENDENT_AMBULATORY_CARE_PROVIDER_SITE_OTHER): Payer: Federal, State, Local not specified - PPO | Admitting: Podiatry

## 2018-04-15 ENCOUNTER — Encounter: Payer: Self-pay | Admitting: Podiatry

## 2018-04-15 ENCOUNTER — Ambulatory Visit (INDEPENDENT_AMBULATORY_CARE_PROVIDER_SITE_OTHER): Payer: Federal, State, Local not specified - PPO

## 2018-04-15 DIAGNOSIS — M2010 Hallux valgus (acquired), unspecified foot: Secondary | ICD-10-CM | POA: Diagnosis not present

## 2018-04-15 DIAGNOSIS — M21611 Bunion of right foot: Secondary | ICD-10-CM | POA: Diagnosis not present

## 2018-04-18 NOTE — Progress Notes (Signed)
Subjective:   Patient ID: Tina Thomas, female   DOB: 22 y.o.   MRN: [id number]   HPI Patient presents stating my right foot is doing well but I have walked on it without any form of immobilization and I would like to have surgery on my left foot in the next several weeks   ROS      Objective:  Physical Exam  Neurovascular status intact with patient found to have well-healing surgical site right but she has traumatized somewhat by walking without immobilization with significant structural deformity noted left     Assessment:  Overall doing well right but she has been noncompliant and needs immobilization during this.  The postop process with significant structural deformity left      Plan:  H&P x-rays reviewed and today I went ahead and I discussed the importance of immobilization that there is been slight movement but it should heal uneventfully where it is.  I then allowed her to read consent form for correction of the left and I did explain the importance of postoperative immobilization and I reviewed alternative treatments complications associated with surgery.  Patient will have Joylene Grapes osteotomy left and wants surgery signed consent form after extensive review.  Patient will be seen back in 2 weeks for surgery and continue immobilization elevation for the right  X-ray indicates the osteotomy the first metatarsal looks excellent with fixation in place with slight movement of the distal cut of the [facility] osteotomy but I do think it will heal uneventfully at this position and unfortunately there was slight movement due to her noncompliance

## 2018-04-27 ENCOUNTER — Telehealth: Payer: Self-pay | Admitting: *Deleted

## 2018-04-27 NOTE — Telephone Encounter (Signed)
"  I am scheduled for surgery tomorrow.  I forgot when I was scheduling it that I have to take my daughter to get her shots.  So, I was wondering if I can reschedule it to the seventeenth."  Dr. Charlsie Merles only does surgeries on Tuesdays.  "Okay, well then how about the twenty-second, can he do it then?"  Yes, he can do it on October 22.  I will get it rescheduled.  "Thank you so much."  I rescheduled the surgery through the surgical center's [facility] [employer].

## 2018-04-27 NOTE — Telephone Encounter (Signed)
Patients postop visits have been rescheduled to reflect her new surgery date.

## 2018-04-27 NOTE — Telephone Encounter (Signed)
"  I called about a hour ago.  I realized I scheduled my surgery back too far.  Can I change that?"  What date are you looking at.  "Can we change it to October 15?"  I'll get it rescheduled."   I changed the date from 05/12/2018 to 05/05/2018 in the surgical center's [employer].

## 2018-04-27 NOTE — Telephone Encounter (Signed)
Postop appointments have been rescheduled. °

## 2018-05-05 ENCOUNTER — Encounter: Payer: Self-pay | Admitting: Podiatry

## 2018-05-05 DIAGNOSIS — M2042 Other hammer toe(s) (acquired), left foot: Secondary | ICD-10-CM | POA: Diagnosis not present

## 2018-05-05 DIAGNOSIS — M2012 Hallux valgus (acquired), left foot: Secondary | ICD-10-CM | POA: Diagnosis not present

## 2018-05-05 DIAGNOSIS — M25572 Pain in left ankle and joints of left foot: Secondary | ICD-10-CM | POA: Diagnosis not present

## 2018-05-06 ENCOUNTER — Other Ambulatory Visit: Payer: Federal, State, Local not specified - PPO

## 2018-05-06 ENCOUNTER — Encounter: Payer: Self-pay | Admitting: Podiatry

## 2018-05-11 ENCOUNTER — Ambulatory Visit (INDEPENDENT_AMBULATORY_CARE_PROVIDER_SITE_OTHER): Payer: Federal, State, Local not specified - PPO | Admitting: Podiatry

## 2018-05-11 ENCOUNTER — Ambulatory Visit (INDEPENDENT_AMBULATORY_CARE_PROVIDER_SITE_OTHER): Payer: Federal, State, Local not specified - PPO

## 2018-05-11 ENCOUNTER — Encounter: Payer: Self-pay | Admitting: Podiatry

## 2018-05-11 VITALS — BP 110/67 | HR 78 | Temp 96.7°F

## 2018-05-11 DIAGNOSIS — M2010 Hallux valgus (acquired), unspecified foot: Secondary | ICD-10-CM

## 2018-05-11 DIAGNOSIS — M2012 Hallux valgus (acquired), left foot: Secondary | ICD-10-CM

## 2018-05-11 MED ORDER — OXYCODONE-ACETAMINOPHEN 10-325 MG PO TABS: 1.0000 | ORAL_TABLET | ORAL | 0 refills | Status: DC | PRN

## 2018-05-14 NOTE — Progress Notes (Signed)
Subjective:   Patient ID: Tina Thomas, female   DOB: 22 y.o.   MRN: [id number]   HPI Patient states overall doing real well but has throbbing that she is on her foot for long periods of time   ROS      Objective:  Physical Exam  Neurovascular status intact negative Homans sign noted with patient's hallux being rectus left and also right with good alignment wound edges well coapted with no drainage noted     Assessment:  Doing well post osteotomy first metatarsal bilateral with left foot pain 1 week in the right foot of proximately 6 weeks     Plan:  H&P reapplied sterile dressing left instructed on continued compression elevation immobilization and reappoint in 3 weeks or earlier if needed  X-rays indicate osteotomies are healing very well good alignment of the hallux with slight but on the right big toe where she did walk on it without any form of immobilization

## 2018-05-20 ENCOUNTER — Other Ambulatory Visit: Payer: Federal, State, Local not specified - PPO

## 2018-06-01 ENCOUNTER — Ambulatory Visit (INDEPENDENT_AMBULATORY_CARE_PROVIDER_SITE_OTHER): Payer: Federal, State, Local not specified - PPO | Admitting: *Deleted

## 2018-06-01 DIAGNOSIS — M2010 Hallux valgus (acquired), unspecified foot: Secondary | ICD-10-CM

## 2018-06-01 DIAGNOSIS — M21611 Bunion of right foot: Secondary | ICD-10-CM

## 2019-01-13 ENCOUNTER — Encounter (HOSPITAL_COMMUNITY): Payer: Self-pay | Admitting: Emergency Medicine

## 2019-01-13 ENCOUNTER — Ambulatory Visit (HOSPITAL_COMMUNITY)
Admission: EM | Admit: 2019-01-13 | Discharge: 2019-01-13 | Disposition: A | Discharge status: Home / Self Care | Payer: Federal, State, Local not specified - PPO | Attending: Family Medicine | Admitting: Family Medicine

## 2019-01-13 DIAGNOSIS — R109 Unspecified abdominal pain: Secondary | ICD-10-CM

## 2019-01-13 DIAGNOSIS — Z3202 Encounter for pregnancy test, result negative: Secondary | ICD-10-CM

## 2019-01-13 DIAGNOSIS — Z87442 Personal history of urinary calculi: Secondary | ICD-10-CM

## 2019-01-13 LAB — POCT URINALYSIS DIP (DEVICE)
Bilirubin Urine: NEGATIVE
Glucose, UA: NEGATIVE mg/dL
Ketones, ur: NEGATIVE mg/dL
Leukocytes,Ua: NEGATIVE
Nitrite: NEGATIVE
Protein, ur: NEGATIVE mg/dL
Specific Gravity, Urine: >=1.03 (ref 1.005–1.030)
Urobilinogen, UA: 0.2 mg/dL (ref 0.0–1.0)
pH: 6.5 (ref 5.0–8.0)

## 2019-01-13 LAB — POCT PREGNANCY, URINE: Preg Test, Ur: NEGATIVE

## 2019-01-13 NOTE — ED Triage Notes (Signed)
Pt c/o uti symptoms since Monday, dysuria, bilateral flank pain, nausea.

## 2019-01-13 NOTE — Discharge Instructions (Signed)
Believe this kidney to be a poor muscle or kidney stone. The blood is most likely from the menstrual cycle There is no signs of infection in your urine and the pregnancy was negative. Make sure you are drinking plenty fluids and you can take ibuprofen or Aleve as needed for pain Follow up as needed for continued or worsening symptoms

## 2019-01-13 NOTE — ED Provider Notes (Signed)
[facility]    CSN: [id number] Arrival date & time: 01/13/19  1332     History   Chief Complaint Chief Complaint  Patient presents with  . Dysuria    HPI Tina Thomas is a 23 y.o. female.   Patient is a 23 year old female who presents with dysuria, bilateral flank pain and nausea since Monday.  The flank pain is more on the right side.  Hurts worse with moving and breathing.  Denies any cough, chest congestion or fevers.  History of UTIs.  Denies any vaginal discharge, itching or irritation.  She is currently on her menstrual cycle.  No lower abdominal or pelvic pain.  She took 2 Keflex that she had left over and Zofran for nausea with some relief.  ROS per HPI      Past Medical History:  Diagnosis Date  . Sickle cell trait (HCC)   . UTI (urinary tract infection)     Patient Active Problem List   Diagnosis Date Noted  . Hypertension 07/29/2017  . Pregnancy 07/27/2017  . Vaginal delivery 07/27/2017    Past Surgical History:  Procedure Laterality Date  . NO PAST SURGERIES      OB History    Gravida  1   Para      Term      Preterm      AB      Living        SAB      TAB      Ectopic      Multiple      Live Births               Home Medications    Prior to Admission medications   Medication Sig Start Date End Date Taking? Authorizing Provider  acetaminophen (TYLENOL) 500 MG tablet Take 1,000 mg by mouth every 6 (six) hours as needed for mild pain.    [provider]  ibuprofen (ADVIL,MOTRIN) 600 MG tablet Take 1 tablet (600 mg total) by mouth every 6 (six) hours. 07/29/17   Pincus LargePhelps, Jazma Y, DO  norethindrone (MICRONOR,CAMILA,ERRIN) 0.35 MG tablet Take 1 tablet (0.35 mg total) by mouth daily. 12/02/17   Pincus LargePhelps, Jazma Y, DO  ondansetron (ZOFRAN) 4 MG tablet Take 4 mg by mouth every 8 (eight) hours as needed for nausea or vomiting.    Lenn Sinkegal, Norman S, DPM  ondansetron (ZOFRAN) 4 MG tablet Take 1 tablet (4 mg total) by mouth  every 8 (eight) hours as needed for nausea or vomiting. 04/01/18   Regal, Kirstie PeriNorman S, DPM  Prenatal Vit-Fe Fumarate-FA (PRENATAL MULTIVITAMIN) TABS tablet Take 1 tablet by mouth daily at 12 noon. 07/29/17   Pincus LargePhelps, Jazma Y, DO    Family History Family History  Problem Relation Age of Onset  . Hypertension Mother   . Diabetes Sister   . Cancer Maternal Grandmother     Social History Social History   Tobacco Use  . Smoking status: Former Games developermoker  . Smokeless tobacco: Never Used  Substance Use Topics  . Alcohol use: Yes    Comment: social   . Drug use: No     Allergies   Compazine [prochlorperazine]   Review of Systems Review of Systems   Physical Exam Triage Vital Signs ED Triage Vitals  Enc Vitals Group     BP 01/13/19 1409 133/78     Pulse Rate 01/13/19 1409 (!) 103     Resp 01/13/19 1409 18     Temp 01/13/19 1409  98 F (36.7 C)     Temp src --      SpO2 01/13/19 1412 100 %     Weight --      Height --      Head Circumference --      Peak Flow --      Pain Score 01/13/19 1411 8     Pain Loc --      Pain Edu? --      Excl. in GC? --    No data found.  Updated Vital Signs BP 133/78   Pulse (!) 103   Temp 98 F (36.7 C)   Resp 18   LMP 01/13/2019   SpO2 100%   Visual Acuity Right Eye Distance:   Left Eye Distance:   Bilateral Distance:    Right Eye Near:   Left Eye Near:    Bilateral Near:     Physical Exam Vitals signs and nursing note reviewed.  Constitutional:      General: She is not in acute distress.    Appearance: Normal appearance. She is not ill-appearing, toxic-appearing or diaphoretic.  HENT:     Head: Normocephalic and atraumatic.  Eyes:     Conjunctiva/sclera: Conjunctivae normal.  Neck:     Musculoskeletal: Normal range of motion.  Pulmonary:     Effort: Pulmonary effort is normal.  Abdominal:     Palpations: Abdomen is soft.     Tenderness: There is no abdominal tenderness.  Musculoskeletal: Normal range of motion.        Back:     Comments: Very mild tenderness to palpation.  No bruising, erythema, swelling. No CVA tenderness  Skin:    General: Skin is warm and dry.  Neurological:     Mental Status: She is alert.  Psychiatric:        Mood and Affect: Mood normal.      UC Treatments / Results  Labs (all labs ordered are listed, but only abnormal results are displayed) Labs Reviewed  POCT URINALYSIS DIP (DEVICE) - Abnormal; Notable for the following components:      Result Value   Hgb urine dipstick MODERATE (*)    All other components within normal limits  POCT PREGNANCY, URINE    EKG None  Radiology No results found.  Procedures Procedures (including critical care time)  Medications Ordered in UC Medications - No data to display  Initial Impression / Assessment and Plan / UC Course  I have reviewed the triage vital signs and the nursing notes.  Pertinent labs & imaging results that were available during my care of the patient were reviewed by me and considered in my medical decision making (see chart for details).     Patient's urine with moderate hemoglobin.  Most likely from menstrual cycle. No signs of urinary tract infection Other differential includes kidney stone.  Patient reporting family history of kidney stone Otherwise patient stable.  She is nontoxic or ill-appearing.  Vital signs stable and rating her pain approximately 5 out of 10. Recommended pushing fluids and taking ibuprofen or Aleve as needed for pain If her symptoms continue or worsen she will need to go the hospital for imaging and further evaluation. Pt understanding and agreed.  Final Clinical Impressions(s) / UC Diagnoses   Final diagnoses:  Flank pain     Discharge Instructions     Believe this kidney to be a poor muscle or kidney stone. The blood is most likely from the menstrual cycle There is no  signs of infection in your urine and the pregnancy was negative. Make sure you are drinking plenty  fluids and you can take ibuprofen or Aleve as needed for pain Follow up as needed for continued or worsening symptoms     ED Prescriptions    None     Controlled Substance Prescriptions [place] Controlled Substance Registry consulted? Not Applicable   Orvan July, NP 01/13/19 1456

## 2019-01-29 DIAGNOSIS — K0889 Other specified disorders of teeth and supporting structures: Secondary | ICD-10-CM | POA: Diagnosis not present

## 2019-06-09 ENCOUNTER — Ambulatory Visit: Payer: Federal, State, Local not specified - PPO | Admitting: Medical

## 2019-06-09 ENCOUNTER — Encounter: Payer: Self-pay | Admitting: Medical

## 2019-08-25 ENCOUNTER — Ambulatory Visit (INDEPENDENT_AMBULATORY_CARE_PROVIDER_SITE_OTHER)
Admission: EM | Admit: 2019-08-25 | Discharge: 2019-08-25 | Disposition: A | Discharge status: Home / Self Care | Payer: Federal, State, Local not specified - PPO | Source: Home / Self Care | Attending: Family Medicine | Admitting: Family Medicine

## 2019-08-25 ENCOUNTER — Emergency Department (HOSPITAL_COMMUNITY)
Admission: EM | Admit: 2019-08-25 | Discharge: 2019-08-26 | Disposition: A | Discharge status: Home / Self Care | Payer: Federal, State, Local not specified - PPO | Attending: Emergency Medicine | Admitting: Emergency Medicine

## 2019-08-25 ENCOUNTER — Encounter (HOSPITAL_COMMUNITY): Payer: Self-pay | Admitting: Emergency Medicine

## 2019-08-25 ENCOUNTER — Other Ambulatory Visit: Payer: Self-pay

## 2019-08-25 ENCOUNTER — Encounter (HOSPITAL_COMMUNITY): Payer: Self-pay

## 2019-08-25 DIAGNOSIS — R55 Syncope and collapse: Secondary | ICD-10-CM | POA: Insufficient documentation

## 2019-08-25 DIAGNOSIS — Z79899 Other long term (current) drug therapy: Secondary | ICD-10-CM | POA: Diagnosis not present

## 2019-08-25 DIAGNOSIS — F17210 Nicotine dependence, cigarettes, uncomplicated: Secondary | ICD-10-CM | POA: Insufficient documentation

## 2019-08-25 DIAGNOSIS — I10 Essential (primary) hypertension: Secondary | ICD-10-CM | POA: Diagnosis not present

## 2019-08-25 DIAGNOSIS — R4020 Unspecified coma: Secondary | ICD-10-CM

## 2019-08-25 DIAGNOSIS — F1010 Alcohol abuse, uncomplicated: Secondary | ICD-10-CM | POA: Insufficient documentation

## 2019-08-25 LAB — BASIC METABOLIC PANEL
Anion gap: 12 (ref 5–15)
BUN: 9 mg/dL (ref 6–20)
CO2: 25 mmol/L (ref 22–32)
Calcium: 9.5 mg/dL (ref 8.9–10.3)
Chloride: 103 mmol/L (ref 98–111)
Creatinine, Ser: 0.79 mg/dL (ref 0.44–1.00)
GFR calc Af Amer: >60 mL/min (ref >60)
GFR calc non Af Amer: >60 mL/min (ref >60)
Glucose, Bld: 94 mg/dL (ref 70–99)
Potassium: 3.8 mmol/L (ref 3.5–5.1)
Sodium: 140 mmol/L (ref 135–145)

## 2019-08-25 LAB — CBC
HCT: 35.4 % — ABNORMAL LOW (ref 36.0–46.0)
Hemoglobin: 11.3 g/dL — ABNORMAL LOW (ref 12.0–15.0)
MCH: 26.4 pg (ref 26.0–34.0)
MCHC: 31.9 g/dL (ref 30.0–36.0)
MCV: 82.7 fL (ref 80.0–100.0)
Platelets: 365 10*3/uL (ref 150–400)
RBC: 4.28 MIL/uL (ref 3.87–5.11)
RDW: 17.3 % — ABNORMAL HIGH (ref 11.5–15.5)
WBC: 4.6 10*3/uL (ref 4.0–10.5)
nRBC: 0 % (ref 0.0–0.2)

## 2019-08-25 LAB — URINALYSIS, ROUTINE W REFLEX MICROSCOPIC
Bilirubin Urine: NEGATIVE
Glucose, UA: NEGATIVE mg/dL
Hgb urine dipstick: NEGATIVE
Ketones, ur: NEGATIVE mg/dL
Leukocytes,Ua: NEGATIVE
Nitrite: NEGATIVE
Protein, ur: NEGATIVE mg/dL
Specific Gravity, Urine: 1.018 (ref 1.005–1.030)
pH: 7 (ref 5.0–8.0)

## 2019-08-25 LAB — I-STAT BETA HCG BLOOD, ED (MC, WL, AP ONLY): I-stat hCG, quantitative: <5 m[IU]/mL (ref <5)

## 2019-08-25 MED ORDER — SODIUM CHLORIDE 0.9% FLUSH: 3.0000 mL | Freq: Once | INTRAVENOUS | Status: DC

## 2019-08-25 NOTE — ED Notes (Signed)
Patient is being discharged from the [facility]gent Care Center and sent to the Emergency Department via wheelchair by staff. Per dr hagler, patient is stable but in need of higher level of care due to syncopal episode. Patient is aware and verbalizes understanding of plan of care.  Vitals:   08/25/19 1917  BP: 140/82  Pulse: 82  Resp: 16  Temp: 98.3 F (36.8 C)  SpO2: 100%

## 2019-08-25 NOTE — ED Notes (Signed)
Dr Tracie Harrier did speak to patient in the in-take room.  Patient agreeable to going to ED.  Patient's mother is taking her to ED

## 2019-08-25 NOTE — ED Triage Notes (Signed)
Pt sent to ED from urgent care.  Pt reports she went home for lunch and woke up at 4:45p on the floor with bloody nose.  Pt was supposed to report back to work after 30 minute lunch.  After leaving urgent care pt and her sister went to eat because her anxiety was high.

## 2019-08-25 NOTE — ED Triage Notes (Signed)
Patient works from home.  Patient remembers clocking out, but does not remember anything.  Patient was "blacked out" from 12:30 pm-4:45 pm.  Patient was alone.  When she awakened, she was on the floor, nose had been bleeding.  Patient also had a headache and has had a panic attack since this occurred.  Patient reports a bruise on right leg.

## 2019-08-26 ENCOUNTER — Emergency Department (HOSPITAL_COMMUNITY): Payer: Federal, State, Local not specified - PPO

## 2019-08-26 MED ORDER — ACETAMINOPHEN 500 MG PO TABS
1000.0000 mg | ORAL_TABLET | Freq: Once | ORAL | Status: AC
  Administered 2019-08-26: 1000 mg via ORAL
  Filled 2019-08-26: qty 2

## 2019-08-26 NOTE — ED Provider Notes (Signed)
  [facility]   [id number] 08/25/19 Arrival Time: 6712  See RN notes. I did speak briefly with her. To ED for further evaluation. Stable upon discharge.   Mardella Layman, MD 08/26/19 1004

## 2019-08-26 NOTE — ED Provider Notes (Signed)
[facility] EMERGENCY DEPARTMENT Provider Note   CSN: [id number] Arrival date & time: 08/25/19  2115     History Chief Complaint  Patient presents with  . Loss of Consciousness    Tina Thomas is a 24 y.o. female.   Loss of Consciousness Episode history:  Single Most recent episode:  Today Duration: unsure. Timing:  Constant Progression:  Resolved Chronicity:  New Context: not blood draw and not dehydration   Witnessed: no   Relieved by:  Nothing Worsened by:  Nothing Ineffective treatments:  None tried Associated symptoms: headaches   Associated symptoms: no anxiety, no focal weakness and no seizures        Past Medical History:  Diagnosis Date  . Sickle cell trait (HCC)   . UTI (urinary tract infection)     Patient Active Problem List   Diagnosis Date Noted  . Hypertension 07/29/2017  . Pregnancy 07/27/2017  . Vaginal delivery 07/27/2017    Past Surgical History:  Procedure Laterality Date  . FOOT SURGERY Bilateral      OB History    Gravida  1   Para      Term      Preterm      AB      Living        SAB      TAB      Ectopic      Multiple      Live Births              Family History  Problem Relation Age of Onset  . Hypertension Mother   . Diabetes Sister   . Cancer Maternal Grandmother     Social History   Tobacco Use  . Smoking status: Current Some Day Smoker    Types: Cigarettes  . Smokeless tobacco: Never Used  Substance Use Topics  . Alcohol use: Yes    Comment: 1 bottle of wine every two days   . Drug use: No    Home Medications Prior to Admission medications   Medication Sig Start Date End Date Taking? Authorizing Provider  acetaminophen (TYLENOL) 500 MG tablet Take 1,000 mg by mouth every 6 (six) hours as needed for mild pain.    [provider]  ibuprofen (ADVIL,MOTRIN) 600 MG tablet Take 1 tablet (600 mg total) by mouth every 6 (six) hours. 07/29/17   Pincus Large, DO    norethindrone (MICRONOR,CAMILA,ERRIN) 0.35 MG tablet Take 1 tablet (0.35 mg total) by mouth daily. 12/02/17   Pincus Large, DO  ondansetron (ZOFRAN) 4 MG tablet Take 4 mg by mouth every 8 (eight) hours as needed for nausea or vomiting.    Lenn Sink, DPM  ondansetron (ZOFRAN) 4 MG tablet Take 1 tablet (4 mg total) by mouth every 8 (eight) hours as needed for nausea or vomiting. 04/01/18   Regal, Kirstie Peri, DPM  Prenatal Vit-Fe Fumarate-FA (PRENATAL MULTIVITAMIN) TABS tablet Take 1 tablet by mouth daily at 12 noon. 07/29/17   Pincus Large, DO    Allergies    Compazine [prochlorperazine]  Review of Systems   Review of Systems  Cardiovascular: Positive for syncope.  Neurological: Positive for headaches. Negative for focal weakness and seizures.  All other systems reviewed and are negative.   Physical Exam Updated Vital Signs BP 138/86 (BP Location: Right Arm)   Pulse 80   Temp 98.4 F (36.9 C) (Oral)   Resp 17   LMP 08/19/2019   SpO2 99%  Physical Exam Vitals and nursing note reviewed.  Constitutional:      Appearance: She is well-developed.  HENT:     Head: Normocephalic and atraumatic.     Mouth/Throat:     Mouth: Mucous membranes are dry.     Pharynx: Oropharynx is clear.  Eyes:     Conjunctiva/sclera: Conjunctivae normal.  Cardiovascular:     Rate and Rhythm: Normal rate and regular rhythm.  Pulmonary:     Effort: No respiratory distress.     Breath sounds: No stridor.  Abdominal:     General: There is no distension.  Musculoskeletal:        General: No swelling or tenderness. Normal range of motion.     Cervical back: Normal range of motion.  Skin:    General: Skin is warm and dry.  Neurological:     General: No focal deficit present.     Mental Status: She is alert.  Psychiatric:        Mood and Affect: Mood normal.     ED Results / Procedures / Treatments   Labs (all labs ordered are listed, but only abnormal results are displayed) Labs  Reviewed  CBC - Abnormal; Notable for the following components:      Result Value   Hemoglobin 11.3 (*)    HCT 35.4 (*)    RDW 17.3 (*)    All other components within normal limits  URINALYSIS, ROUTINE W REFLEX MICROSCOPIC - Abnormal; Notable for the following components:   APPearance HAZY (*)    All other components within normal limits  BASIC METABOLIC PANEL  CBG MONITORING, ED  I-STAT BETA HCG BLOOD, ED ([facility], [facility], [facility] ONLY)    EKG None  Radiology CT Head Wo Contrast  Result Date: 08/26/2019 CLINICAL DATA:  Fall with loss of consciousness. EXAM: CT HEAD WITHOUT CONTRAST CT MAXILLOFACIAL WITHOUT CONTRAST TECHNIQUE: Multidetector CT imaging of the head and maxillofacial structures were performed using the standard protocol without intravenous contrast. Multiplanar CT image reconstructions of the maxillofacial structures were also generated. COMPARISON:  None. FINDINGS: CT HEAD FINDINGS Brain: There is no mass, hemorrhage or extra-axial collection. The size and configuration of the ventricles and extra-axial CSF spaces are normal. The brain parenchyma is normal, without evidence of acute or chronic infarction. Vascular: No hyperdense vessel or unexpected vascular calcification. Skull: The visualized skull base, calvarium and extracranial soft tissues are normal. CT MAXILLOFACIAL FINDINGS Osseous: --Complex facial fracture types: No LeFort, zygomaticomaxillary complex or nasoorbitoethmoidal fracture. --Simple fracture types: None. --Mandible, hard palate and teeth: No acute abnormality. Orbits: The globes are intact. Normal appearance of the intra- and extraconal fat. Symmetric extraocular muscles. Sinuses: No fluid levels or advanced mucosal thickening. Soft tissues: Normal visualized extracranial soft tissues. IMPRESSION: 1. Normal head CT. 2. No facial fracture. Electronically Signed   By: Ulyses Jarred M.D.   On: 08/26/2019 02:19   CT Maxillofacial Wo Contrast  Result Date: 08/26/2019 CLINICAL  DATA:  Fall with loss of consciousness. EXAM: CT HEAD WITHOUT CONTRAST CT MAXILLOFACIAL WITHOUT CONTRAST TECHNIQUE: Multidetector CT imaging of the head and maxillofacial structures were performed using the standard protocol without intravenous contrast. Multiplanar CT image reconstructions of the maxillofacial structures were also generated. COMPARISON:  None. FINDINGS: CT HEAD FINDINGS Brain: There is no mass, hemorrhage or extra-axial collection. The size and configuration of the ventricles and extra-axial CSF spaces are normal. The brain parenchyma is normal, without evidence of acute or chronic infarction. Vascular: No hyperdense vessel or unexpected vascular calcification.  Skull: The visualized skull base, calvarium and extracranial soft tissues are normal. CT MAXILLOFACIAL FINDINGS Osseous: --Complex facial fracture types: No LeFort, zygomaticomaxillary complex or nasoorbitoethmoidal fracture. --Simple fracture types: None. --Mandible, hard palate and teeth: No acute abnormality. Orbits: The globes are intact. Normal appearance of the intra- and extraconal fat. Symmetric extraocular muscles. Sinuses: No fluid levels or advanced mucosal thickening. Soft tissues: Normal visualized extracranial soft tissues. IMPRESSION: 1. Normal head CT. 2. No facial fracture. Electronically Signed   By: Deatra Robinson M.D.   On: 08/26/2019 02:19    Procedures Procedures (including critical care time)  Medications Ordered in ED Medications  sodium chloride flush (NS) 0.9 % injection 3 mL (has no administration in time range)  acetaminophen (TYLENOL) tablet 1,000 mg (1,000 mg Oral Given 08/26/19 0140)    ED Course  I have reviewed the triage vital signs and the nursing notes.  Pertinent labs & imaging results that were available during my care of the patient were reviewed by me and considered in my medical decision making (see chart for details).    MDM Rules/Calculators/A&P                     LOC and anxiety.  Ct to eval for traumatic injury as she has a headache and subsequent epistaxis after fall. If ok, fu w/ pcp.  Workup ok. Stable for discharge.   Final Clinical Impression(s) / ED Diagnoses Final diagnoses:  Loss of consciousness (HCC)  Alcohol abuse    Rx / DC Orders ED Discharge Orders    None       Shalva Rozycki, Barbara Cower, MD 08/26/19 [phone]

## 2020-01-12 ENCOUNTER — Ambulatory Visit (INDEPENDENT_AMBULATORY_CARE_PROVIDER_SITE_OTHER): Payer: Self-pay | Admitting: Lactation Services

## 2020-01-12 ENCOUNTER — Encounter: Payer: Self-pay | Admitting: Family Medicine

## 2020-01-12 ENCOUNTER — Other Ambulatory Visit: Payer: Self-pay

## 2020-01-12 DIAGNOSIS — Z3201 Encounter for pregnancy test, result positive: Secondary | ICD-10-CM

## 2020-01-12 LAB — POCT PREGNANCY, URINE
Preg Test, Ur: POSITIVE — AB
Preg Test, Ur: POSITIVE — AB

## 2020-01-12 MED ORDER — BONJESTA 20-20 MG PO TBCR: 20.0000 mg | EXTENDED_RELEASE_TABLET | Freq: Two times a day (BID) | ORAL | 0 refills | Status: DC | PRN

## 2020-01-12 MED ORDER — PRENATAL VITAMINS 28-0.8 MG PO TABS: 1.0000 | ORAL_TABLET | Freq: Every day | ORAL | 11 refills | Status: DC

## 2020-01-12 NOTE — Progress Notes (Signed)
Patient dropped off Urine for pregnancy test. Pregnancy test +. Patient called at 4:09 and given results.    Patient reports she is nauseated and is occurring all day long. She has anxiety with meds in the class of Phenergan, Bonjesta ordered.   She reports that her last period was sometime in May, she reports she has regular periods and she does not track her periods but is unsure of when the last one was.   She is not taking Prenatal Vitamins. She uses Statistician at [facility].   Sent in prescription for Nausea and PNV. Advised patient to try Tums or Rolaids for heart burn.   [place] ordered to confirm dating. PNC to be started after [place] results given.   Patient to call with questions/concerns as needed.

## 2020-01-15 NOTE — Progress Notes (Signed)
I have reviewed the chart and agree with nursing staff's documentation of this patient's encounter.  Raelyn Mora, CNM 01/15/2020 9:33 AM

## 2020-01-17 ENCOUNTER — Inpatient Hospital Stay (HOSPITAL_COMMUNITY): Payer: Self-pay

## 2020-01-17 ENCOUNTER — Encounter (HOSPITAL_COMMUNITY): Payer: Self-pay | Admitting: Obstetrics and Gynecology

## 2020-01-17 ENCOUNTER — Other Ambulatory Visit: Payer: Self-pay

## 2020-01-17 ENCOUNTER — Inpatient Hospital Stay (HOSPITAL_COMMUNITY)
Admission: AD | Admit: 2020-01-17 | Discharge: 2020-01-17 | Discharge status: Against Medical Advice | Payer: Self-pay | Attending: Obstetrics and Gynecology | Admitting: Obstetrics and Gynecology

## 2020-01-17 DIAGNOSIS — Z87891 Personal history of nicotine dependence: Secondary | ICD-10-CM | POA: Insufficient documentation

## 2020-01-17 DIAGNOSIS — Z79899 Other long term (current) drug therapy: Secondary | ICD-10-CM | POA: Insufficient documentation

## 2020-01-17 DIAGNOSIS — Z3A01 Less than 8 weeks gestation of pregnancy: Secondary | ICD-10-CM | POA: Insufficient documentation

## 2020-01-17 DIAGNOSIS — Z5329 Procedure and treatment not carried out because of patient's decision for other reasons: Secondary | ICD-10-CM

## 2020-01-17 DIAGNOSIS — D573 Sickle-cell trait: Secondary | ICD-10-CM | POA: Insufficient documentation

## 2020-01-17 DIAGNOSIS — O209 Hemorrhage in early pregnancy, unspecified: Secondary | ICD-10-CM | POA: Insufficient documentation

## 2020-01-17 DIAGNOSIS — O26891 Other specified pregnancy related conditions, first trimester: Secondary | ICD-10-CM | POA: Insufficient documentation

## 2020-01-17 DIAGNOSIS — M545 Low back pain: Secondary | ICD-10-CM | POA: Insufficient documentation

## 2020-01-17 DIAGNOSIS — O99011 Anemia complicating pregnancy, first trimester: Secondary | ICD-10-CM | POA: Insufficient documentation

## 2020-01-17 DIAGNOSIS — O3680X0 Pregnancy with inconclusive fetal viability, not applicable or unspecified: Secondary | ICD-10-CM | POA: Insufficient documentation

## 2020-01-17 DIAGNOSIS — R519 Headache, unspecified: Secondary | ICD-10-CM | POA: Insufficient documentation

## 2020-01-17 DIAGNOSIS — O039 Complete or unspecified spontaneous abortion without complication: Secondary | ICD-10-CM

## 2020-01-17 HISTORY — DX: Anxiety disorder, unspecified: F41.9

## 2020-01-17 LAB — CBC
HCT: 31.8 % — ABNORMAL LOW (ref 36.0–46.0)
Hemoglobin: 10.5 g/dL — ABNORMAL LOW (ref 12.0–15.0)
MCH: 27.2 pg (ref 26.0–34.0)
MCHC: 33 g/dL (ref 30.0–36.0)
MCV: 82.4 fL (ref 80.0–100.0)
Platelets: 285 10*3/uL (ref 150–400)
RBC: 3.86 MIL/uL — ABNORMAL LOW (ref 3.87–5.11)
RDW: 14.6 % (ref 11.5–15.5)
WBC: 8.5 10*3/uL (ref 4.0–10.5)
nRBC: 0 % (ref 0.0–0.2)

## 2020-01-17 LAB — HCG, QUANTITATIVE, PREGNANCY: hCG, Beta Chain, Quant, S: 58470 m[IU]/mL — ABNORMAL HIGH (ref <5)

## 2020-01-17 NOTE — MAU Provider Note (Signed)
History     CSN: [id number]  Arrival date and time: 01/17/20 1011   First Provider Initiated Contact with Patient 01/17/20 1207      Chief Complaint  Patient presents with   Back Pain   Vaginal Bleeding   HPI  Tina Thomas is a 24 yo G4P1020 at 6weeks 3 days EGA by LMP. She presented to MAU with complaints of vaginal bleeding and concerns that she was miscarrying. She reports her symptoms started last night with cramping in her back. She saturated one towel last night and one towel this morning (bath towels). She reports passing lots of small clots, then two clots that were the size of lemons. This was a planned pregnancy and is a desired pregnancy. She denies nausea and vomiting. She denies feeling weak or light headed, but does have a mild headache (which she thinks is because she hasn't eaten anything).  OB History    Gravida  4   Para  1   Term  1   Preterm      AB  2   Living        SAB      TAB  2   Ectopic      Multiple      Live Births              Past Medical History:  Diagnosis Date   Anxiety    Sickle cell trait (HCC)    UTI (urinary tract infection)     Past Surgical History:  Procedure Laterality Date   DILATION AND CURETTAGE OF UTERUS     FOOT SURGERY Bilateral     Family History  Problem Relation Age of Onset   Hypertension Mother    Diabetes Sister    Cancer Maternal Grandmother     Social History   Tobacco Use   Smoking status: Former Smoker    Types: Cigarettes   Smokeless tobacco: Never Used  Substance Use Topics   Alcohol use: Not Currently    Comment: 1 bottle of wine every two days    Drug use: No    Allergies:  Allergies  Allergen Reactions   Compazine [Prochlorperazine] Anxiety    Medications Prior to Admission  Medication Sig Dispense Refill Last Dose   acetaminophen (TYLENOL) 500 MG tablet Take 1,000 mg by mouth every 6 (six) hours as needed for mild pain.      Doxylamine-Pyridoxine ER  (BONJESTA) 20-20 MG TBCR Take 20 mg by mouth 2 (two) times daily as needed. Start by taking 1 tablet (20 mg) at bedtime and if controls nausea can take 1 tablet (20 mg) in the morning and then 1 tablet (20 mg) every 12 hours 60 tablet 0    ibuprofen (ADVIL,MOTRIN) 600 MG tablet Take 1 tablet (600 mg total) by mouth every 6 (six) hours. 30 tablet 0    norethindrone (MICRONOR,CAMILA,ERRIN) 0.35 MG tablet Take 1 tablet (0.35 mg total) by mouth daily. 1 Package 11    ondansetron (ZOFRAN) 4 MG tablet Take 4 mg by mouth every 8 (eight) hours as needed for nausea or vomiting.      ondansetron (ZOFRAN) 4 MG tablet Take 1 tablet (4 mg total) by mouth every 8 (eight) hours as needed for nausea or vomiting. 20 tablet 0    Prenatal Vit-Fe Fumarate-FA (PRENATAL MULTIVITAMIN) TABS tablet Take 1 tablet by mouth daily at 12 noon. 30 tablet 0    Prenatal Vit-Fe Fumarate-FA (PRENATAL VITAMINS) 28-0.8 MG TABS Take 1 tablet by  mouth daily. 30 tablet 11     Review of Systems  Constitutional: Negative.   HENT: Negative.   Eyes: Negative.   Respiratory: Negative.   Cardiovascular: Negative.   Gastrointestinal: Negative.   Endocrine: Negative.   Genitourinary: Positive for vaginal bleeding.  Musculoskeletal: Negative.   Skin: Negative.   Allergic/Immunologic: Negative.   Neurological: Negative.   Hematological: Negative.   Psychiatric/Behavioral: Negative.    Physical Exam   Blood pressure 111/70, pulse 96, temperature (!) 97.5 F (36.4 C), resp. rate 16, height 5\' 9"  (1.753 m), weight 60.4 kg, last menstrual period 12/03/2019, SpO2 100 %, currently breastfeeding.  Physical Exam Vitals and nursing note reviewed. Exam conducted with a chaperone present.  Constitutional:      Appearance: Normal appearance.  HENT:     Head: Normocephalic and atraumatic.     Nose: Nose normal.     Mouth/Throat:     Mouth: Mucous membranes are moist.     Pharynx: Oropharynx is clear.  Eyes:     Extraocular  Movements: Extraocular movements intact.     Conjunctiva/sclera: Conjunctivae normal.     Pupils: Pupils are equal, round, and reactive to light.  Cardiovascular:     Rate and Rhythm: Normal rate and regular rhythm.     Pulses: Normal pulses.     Heart sounds: Normal heart sounds.  Pulmonary:     Effort: Pulmonary effort is normal.     Breath sounds: Normal breath sounds.  Abdominal:     General: Abdomen is flat. Bowel sounds are normal.     Palpations: Abdomen is soft.  Genitourinary:    Comments: Vaginal bleeding with blood in the vaginal vault and extruding from the Os, open cervical Os Musculoskeletal:        General: Normal range of motion.     Cervical back: Normal range of motion and neck supple.  Skin:    General: Skin is warm and dry.  Neurological:     General: No focal deficit present.     Mental Status: She is alert and oriented to person, place, and time.  Psychiatric:        Mood and Affect: Mood normal.        Behavior: Behavior normal.        Thought Content: Thought content normal.        Judgment: Judgment normal.     MAU Course  Procedures  MDM - Likely SAB, but will also r/o ectopic - Transvaginal 12/05/2019 - CBC, Type and Screen, HCG quant  Results for orders placed or performed during the hospital encounter of 01/17/20 (from the past 24 hour(s))  CBC     Status: Abnormal   Collection Time: 01/17/20 12:37 PM  Result Value Ref Range   WBC 8.5 4.0 - 10.5 K/uL   RBC 3.86 (L) 3.87 - 5.11 MIL/uL   Hemoglobin 10.5 (L) 12.0 - 15.0 g/dL   HCT 01/19/20 (L) 36 - 46 %   MCV 82.4 80.0 - 100.0 fL   MCH 27.2 26.0 - 34.0 pg   MCHC 33.0 30.0 - 36.0 g/dL   RDW 40.9 81.1 - 91.4 %   Platelets 285 150 - 400 K/uL   nRBC 0.0 0.0 - 0.2 %  hCG, quantitative, pregnancy     Status: Abnormal   Collection Time: 01/17/20 12:37 PM  Result Value Ref Range   hCG, Beta Chain, Quant, S 58,470 (H) <5 mIU/mL    Assessment and Plan  24 yo G4P1020 at 6  weeks 3 days EGA with concerns  for miscarriage -patient left AMA prior to [place] -discharge instructions available in myChart  Tina Thomas 01/17/2020, 12:08 PM

## 2020-01-17 NOTE — ED Provider Notes (Signed)
24 year old female G2, P1 approximately [redacted] weeks pregnant presenting complaining of vaginal bleeding that started last night and persisted throughout the day today.  She endorsed some lower back pain and mild abdominal cramping but otherwise no other symptoms.  She is well-appearing, abdomen is soft nontender, no significant reproducible pain to her lower back.  She has not had a formal ultrasound but states that she had a positive pregnancy test at home as well as a confirmed pregnancy test at her OB/GYN at [facility].  Patient will be transferred over to MAU for additional evaluation.   Fayrene Helper, PA-C 01/17/20 1046    Tilden Fossa, MD 01/18/20 1339

## 2020-01-17 NOTE — ED Notes (Addendum)
Attempted report to MAU RN x3. Spoke with secretary who took triage number and will have MAU RN contact this RN for report.

## 2020-01-17 NOTE — MAU Note (Signed)
Sent from ER.  Started bleeding last night, has soaked 2 towels, passed some clots, passed a really large one this morning-it work her up. preg confirmed at [facility] on [address]. Some back pain, is pretty sure she miscarried.

## 2020-01-17 NOTE — MAU Note (Signed)
Went to get pt from the room to take her to [place] and she was gone.  Gown and pad on the bed with doors open to bathroom and room.  Pt left AMA.  Notified Dr. Salomon Mast.

## 2020-01-17 NOTE — ED Notes (Signed)
Patient assessed by PA Laveda Norman and okay to go to MAU at this time.

## 2020-01-17 NOTE — Discharge Instructions (Signed)
Miscarriage A miscarriage is the loss of an unborn baby (fetus) before the 20th week of pregnancy. Follow these instructions at home: Medicines   Take over-the-counter and prescription medicines only as told by your doctor.  If you were prescribed antibiotic medicine, take it as told by your doctor. Do not stop taking the antibiotic even if you start to feel better.  Do not take NSAIDs unless your doctor says that this is safe for you. NSAIDs include aspirin and ibuprofen. These medicines can cause bleeding. Activity  Rest as directed. Ask your doctor what activities are safe for you.  Have someone help you at home during this time. General instructions  Write down how many pads you use each day and how soaked they are.  Watch the amount of tissue or clumps of blood (blood clots) that you pass from your vagina. Save any large amounts of tissue for your doctor.  Do not use tampons, douche, or have sex until your doctor approves.  To help you and your partner with the process of grieving, talk with your doctor or seek counseling.  When you are ready, meet with your doctor to talk about steps you should take for your health. Also, talk with your doctor about steps to take to have a healthy pregnancy in the future.  Keep all follow-up visits as told by your doctor. This is important. Contact a doctor if:  You have a fever or chills.  You have vaginal discharge that smells bad.  You have more bleeding. Get help right away if:  You have very bad cramps or pain in your back or belly.  You pass clumps of blood that are walnut-sized or larger from your vagina.  You pass tissue that is walnut-sized or larger from your vagina.  You soak more than 1 regular pad in an hour.  You get light-headed or weak.  You faint (pass out).  You have feelings of sadness that do not go away, or you have thoughts of hurting yourself. Summary  A miscarriage is the loss of an unborn baby before  the 20th week of pregnancy.  Follow your doctor's instructions for home care. Keep all follow-up appointments.  To help you and your partner with the process of grieving, talk with your doctor or seek counseling. This information is not intended to replace advice given to you by your health care provider. Make sure you discuss any questions you have with your health care provider. Document Revised: 10/30/2018 Document Reviewed: 08/13/2016 Elsevier Patient Education  2020 [employer]  

## 2020-02-07 ENCOUNTER — Ambulatory Visit: Payer: Medicaid Other

## 2020-02-07 ENCOUNTER — Ambulatory Visit: Admission: RE | Admit: 2020-02-07 | Payer: Medicaid Other | Source: Ambulatory Visit

## 2020-02-25 ENCOUNTER — Telehealth: Payer: Self-pay | Admitting: Obstetrics and Gynecology

## 2020-02-25 NOTE — Telephone Encounter (Signed)
Received a call from the patient requesting an appointment. She wanted to be checked for STD, and an annual exam. I informed her she would be able to get scheduled for a self swab on Monday 08/09, and I could schedule her annual for the next available appointment. She stated she wanted the appointment, and then to be put on the wait list if anyone cancel. I told her I could, then she said never mind. I think I'll look for another OB GYN. Then she hanged up the phone.

## 2020-02-28 ENCOUNTER — Ambulatory Visit: Payer: Medicaid Other

## 2020-04-11 ENCOUNTER — Ambulatory Visit (INDEPENDENT_AMBULATORY_CARE_PROVIDER_SITE_OTHER): Payer: Self-pay | Admitting: Women's Health

## 2020-04-11 ENCOUNTER — Other Ambulatory Visit (HOSPITAL_COMMUNITY)
Admission: RE | Admit: 2020-04-11 | Discharge: 2020-04-11 | Disposition: A | Discharge status: Home / Self Care | Payer: Medicaid Other | Source: Ambulatory Visit | Attending: Women's Health | Admitting: Women's Health

## 2020-04-11 ENCOUNTER — Encounter: Payer: Self-pay | Admitting: Women's Health

## 2020-04-11 ENCOUNTER — Other Ambulatory Visit: Payer: Self-pay

## 2020-04-11 VITALS — BP 126/77 | HR 87 | Ht 69.0 in | Wt 137.3 lb

## 2020-04-11 DIAGNOSIS — N898 Other specified noninflammatory disorders of vagina: Secondary | ICD-10-CM | POA: Insufficient documentation

## 2020-04-11 MED ORDER — METRONIDAZOLE 500 MG PO TABS: 500.0000 mg | ORAL_TABLET | Freq: Two times a day (BID) | ORAL | 0 refills | Status: DC

## 2020-04-11 NOTE — Progress Notes (Signed)
Reviewed with patient that she can be seen by a nurse and she reports that would be fine that she has no concerns to discuss with a provider at this time.   Patient reports she has a history of off white oscillating between thick or thin vaginal discharge with odor. Mild itchiness, no burning. Patient denies she is currently pregnant. This discharge has occurred since miscarriage for at least the last 2 weeks. She would like STD testing with a vaginal swab. She reports the odor is fishy and has been persistent despite cleansing and Boric Acid and vagisil.  She declined STD blood testing (Hep B, Hep C, and HIV). She is concerned she may have been exposed to Chlamydia based on the symptoms she is experiencing after googling. She is not aware she has been exposed to STD she just wants to make sure.   Patient collected self swab after instructions given. Reviewed with her she will be called for abnormal results. Flagyl sent in today as symptoms consistent with BV. Informed her she may see the results prior to provider on My Chart.

## 2020-04-11 NOTE — Progress Notes (Signed)
Patient was assessed and managed by nursing staff during this encounter. I have reviewed the chart and agree with the documentation and plan. I have also made any necessary editorial changes.  Marylen Ponto, NP 04/11/2020 3:28 PM

## 2020-04-12 LAB — CERVICOVAGINAL ANCILLARY ONLY
Bacterial Vaginitis (gardnerella): POSITIVE — AB
Candida Glabrata: NEGATIVE
Candida Vaginitis: NEGATIVE
Chlamydia: NEGATIVE
Comment: NEGATIVE
Comment: NEGATIVE
Comment: NEGATIVE
Comment: NEGATIVE
Comment: NEGATIVE
Comment: NORMAL
Neisseria Gonorrhea: NEGATIVE
Trichomonas: NEGATIVE

## 2020-07-16 ENCOUNTER — Other Ambulatory Visit: Payer: Self-pay

## 2020-07-16 ENCOUNTER — Emergency Department (HOSPITAL_COMMUNITY)
Admission: EM | Admit: 2020-07-16 | Discharge: 2020-07-17 | Discharge status: Against Medical Advice | Payer: Medicaid Other

## 2020-07-16 ENCOUNTER — Ambulatory Visit (HOSPITAL_COMMUNITY)
Admission: EM | Admit: 2020-07-16 | Discharge: 2020-07-16 | Discharge status: Against Medical Advice | Payer: Medicaid Other

## 2020-07-16 NOTE — ED Notes (Signed)
Pt said she could not wait.

## 2020-07-17 ENCOUNTER — Encounter (HOSPITAL_COMMUNITY): Payer: Self-pay

## 2020-07-17 ENCOUNTER — Ambulatory Visit (HOSPITAL_COMMUNITY)
Admission: EM | Admit: 2020-07-17 | Discharge: 2020-07-17 | Disposition: A | Discharge status: Home / Self Care | Payer: Medicaid Other | Attending: Family Medicine | Admitting: Family Medicine

## 2020-07-17 ENCOUNTER — Other Ambulatory Visit: Payer: Self-pay

## 2020-07-17 DIAGNOSIS — U071 COVID-19: Secondary | ICD-10-CM

## 2020-07-17 DIAGNOSIS — J069 Acute upper respiratory infection, unspecified: Secondary | ICD-10-CM

## 2020-07-17 DIAGNOSIS — Z3202 Encounter for pregnancy test, result negative: Secondary | ICD-10-CM

## 2020-07-17 DIAGNOSIS — Z20822 Contact with and (suspected) exposure to covid-19: Secondary | ICD-10-CM

## 2020-07-17 LAB — POC URINE PREG, ED: Preg Test, Ur: NEGATIVE

## 2020-07-17 MED ORDER — ALBUTEROL SULFATE HFA 108 (90 BASE) MCG/ACT IN AERS: 1.0000 | INHALATION_SPRAY | Freq: Four times a day (QID) | RESPIRATORY_TRACT | 0 refills | Status: DC | PRN

## 2020-07-17 MED ORDER — PROMETHAZINE-DM 6.25-15 MG/5ML PO SYRP: 5.0000 mL | ORAL_SOLUTION | Freq: Four times a day (QID) | ORAL | 0 refills | Status: DC | PRN

## 2020-07-17 MED ORDER — GUAIFENESIN ER 600 MG PO TB12: 600.0000 mg | ORAL_TABLET | Freq: Two times a day (BID) | ORAL | 0 refills | Status: DC | PRN

## 2020-07-17 NOTE — ED Triage Notes (Addendum)
Pt in with c/o cough, st and cp when she coughs that has been going on for about 3 days now. Also c/o vomiting yesterday  Pt has not had medication for sxs  Denies Nausea, diarrhea, fever  Also requesting pregnancy test

## 2020-07-18 ENCOUNTER — Ambulatory Visit (INDEPENDENT_AMBULATORY_CARE_PROVIDER_SITE_OTHER): Payer: HRSA Program

## 2020-07-18 ENCOUNTER — Encounter (HOSPITAL_COMMUNITY): Payer: Self-pay | Admitting: Emergency Medicine

## 2020-07-18 ENCOUNTER — Ambulatory Visit (HOSPITAL_COMMUNITY)
Admission: EM | Admit: 2020-07-18 | Discharge: 2020-07-18 | Disposition: A | Discharge status: Home / Self Care | Payer: HRSA Program | Attending: Family Medicine | Admitting: Family Medicine

## 2020-07-18 DIAGNOSIS — R079 Chest pain, unspecified: Secondary | ICD-10-CM

## 2020-07-18 DIAGNOSIS — R0602 Shortness of breath: Secondary | ICD-10-CM

## 2020-07-18 DIAGNOSIS — U071 COVID-19: Secondary | ICD-10-CM

## 2020-07-18 DIAGNOSIS — R112 Nausea with vomiting, unspecified: Secondary | ICD-10-CM

## 2020-07-18 LAB — RESP PANEL BY RT-PCR (FLU A&B, COVID) ARPGX2
Influenza A by PCR: NEGATIVE
Influenza B by PCR: NEGATIVE
SARS Coronavirus 2 by RT PCR: POSITIVE — AB

## 2020-07-18 MED ORDER — ONDANSETRON 4 MG PO TBDP: 4.0000 mg | ORAL_TABLET | Freq: Three times a day (TID) | ORAL | 0 refills | Status: DC | PRN

## 2020-07-18 MED ORDER — HYDROCODONE-HOMATROPINE 5-1.5 MG/5ML PO SYRP: 5.0000 mL | ORAL_SOLUTION | Freq: Four times a day (QID) | ORAL | 0 refills | Status: DC | PRN

## 2020-07-18 NOTE — ED Provider Notes (Signed)
[facility]    CSN: [id number] Arrival date & time: 07/17/20  1911      History   Chief Complaint Chief Complaint  Patient presents with   Sore Throat   Cough   Chest Pain    HPI Tina Thomas is a 24 y.o. female.   Here today with 3 days of cough, sore throat, diffuse chest and upper back soreness with coughing and now today started with some N/V. Denies known fever, SOB, abdominal pain, diarrhea, rashes. Has not been taking anything for sxs. Parents recently diagnosed with COVID. No known chronic medical problems. She is also wanting a pregnancy test - had an abortion in October and has not had a normal period since though has had some light spotting here and there. Has had unprotected intercourse, not on contraceptives.       Past Medical History:  Diagnosis Date   Anxiety    Sickle cell trait (HCC)    UTI (urinary tract infection)     Patient Active Problem List   Diagnosis Date Noted   Hypertension 07/29/2017   Pregnancy 07/27/2017   Vaginal delivery 07/27/2017    Past Surgical History:  Procedure Laterality Date   DILATION AND CURETTAGE OF UTERUS     FOOT SURGERY Bilateral     OB History    Gravida  4   Para  1   Term  1   Preterm      AB  2   Living        SAB      IAB  2   Ectopic      Multiple      Live Births               Home Medications    Prior to Admission medications   Medication Sig Start Date End Date Taking? Authorizing Provider  albuterol (VENTOLIN HFA) 108 (90 Base) MCG/ACT inhaler Inhale 1-2 puffs into the lungs every 6 (six) hours as needed for wheezing or shortness of breath. 07/17/20  Yes Particia Nearing, PA-C  guaiFENesin (MUCINEX) 600 MG 12 hr tablet Take 1 tablet (600 mg total) by mouth 2 (two) times daily as needed. 07/17/20  Yes Particia Nearing, PA-C  promethazine-dextromethorphan (PROMETHAZINE-DM) 6.25-15 MG/5ML syrup Take 5 mLs by mouth 4 (four) times daily as needed  for cough. 07/17/20  Yes Particia Nearing, PA-C  acetaminophen (TYLENOL) 500 MG tablet Take 1,000 mg by mouth every 6 (six) hours as needed for mild pain.    [provider]  Doxylamine-Pyridoxine ER (BONJESTA) 20-20 MG TBCR Take 20 mg by mouth 2 (two) times daily as needed. Start by taking 1 tablet (20 mg) at bedtime and if controls nausea can take 1 tablet (20 mg) in the morning and then 1 tablet (20 mg) every 12 hours 01/12/20   Raelyn Mora, CNM  ibuprofen (ADVIL,MOTRIN) 600 MG tablet Take 1 tablet (600 mg total) by mouth every 6 (six) hours. 07/29/17   Pincus Large, DO  metroNIDAZOLE (FLAGYL) 500 MG tablet Take 1 tablet (500 mg total) by mouth 2 (two) times daily. 04/11/20   Nugent, Odie Sera, NP  norethindrone (MICRONOR,CAMILA,ERRIN) 0.35 MG tablet Take 1 tablet (0.35 mg total) by mouth daily. 12/02/17   Pincus Large, DO  ondansetron (ZOFRAN) 4 MG tablet Take 4 mg by mouth every 8 (eight) hours as needed for nausea or vomiting.    Lenn Sink, DPM  ondansetron (ZOFRAN) 4 MG tablet Take  1 tablet (4 mg total) by mouth every 8 (eight) hours as needed for nausea or vomiting. 04/01/18   Regal, Kirstie Peri, DPM  Prenatal Vit-Fe Fumarate-FA (PRENATAL MULTIVITAMIN) TABS tablet Take 1 tablet by mouth daily at 12 noon. 07/29/17   Pincus Large, DO  Prenatal Vit-Fe Fumarate-FA (PRENATAL VITAMINS) 28-0.8 MG TABS Take 1 tablet by mouth daily. 01/12/20   Raelyn Mora, CNM    Family History Family History  Problem Relation Age of Onset   Hypertension Mother    Diabetes Sister    Cancer Maternal Grandmother     Social History Social History   Tobacco Use   Smoking status: Former Smoker    Types: Cigarettes   Smokeless tobacco: Never Used  Substance Use Topics   Alcohol use: Not Currently    Comment: 1 bottle of wine every two days    Drug use: No     Allergies   Compazine [prochlorperazine]   Review of Systems Review of Systems PER HPI    Physical  Exam Triage Vital Signs ED Triage Vitals  Enc Vitals Group     BP 07/17/20 2056 (!) 143/83     Pulse Rate 07/17/20 2053 78     Resp 07/17/20 2053 19     Temp 07/17/20 2053 98 F (36.7 C)     Temp Source 07/17/20 2053 Oral     SpO2 07/17/20 2053 100 %     Weight --      Height --      Head Circumference --      Peak Flow --      Pain Score 07/17/20 2050 7     Pain Loc --      Pain Edu? --      Excl. in GC? --    No data found.  Updated Vital Signs BP (!) 143/83    Pulse 78    Temp 98 F (36.7 C) (Oral)    Resp 19    SpO2 100%    Breastfeeding No Comment: pt has not had period  Visual Acuity Right Eye Distance:   Left Eye Distance:   Bilateral Distance:    Right Eye Near:   Left Eye Near:    Bilateral Near:     Physical Exam Vitals and nursing note reviewed.  Constitutional:      Appearance: Normal appearance. She is not ill-appearing.  HENT:     Head: Atraumatic.     Right Ear: Tympanic membrane normal.     Left Ear: Tympanic membrane normal.     Nose: Rhinorrhea present.     Mouth/Throat:     Mouth: Mucous membranes are moist.     Pharynx: Posterior oropharyngeal erythema present.  Eyes:     Extraocular Movements: Extraocular movements intact.     Conjunctiva/sclera: Conjunctivae normal.  Cardiovascular:     Rate and Rhythm: Normal rate and regular rhythm.     Heart sounds: Normal heart sounds.  Pulmonary:     Effort: Pulmonary effort is normal.     Breath sounds: Normal breath sounds.  Abdominal:     General: Bowel sounds are normal. There is no distension.     Palpations: Abdomen is soft.     Tenderness: There is no abdominal tenderness. There is no right CVA tenderness, left CVA tenderness or guarding.  Musculoskeletal:        General: Normal range of motion.     Cervical back: Normal range of motion and neck supple.  Skin:  General: Skin is warm and dry.  Neurological:     Mental Status: She is alert and oriented to person, place, and time.   Psychiatric:        Mood and Affect: Mood normal.        Thought Content: Thought content normal.        Judgment: Judgment normal.      UC Treatments / Results  Labs (all labs ordered are listed, but only abnormal results are displayed) Labs Reviewed  RESP PANEL BY RT-PCR (FLU A&B, COVID) ARPGX2 - Abnormal; Notable for the following components:      Result Value   SARS Coronavirus 2 by RT PCR POSITIVE (*)    All other components within normal limits  POC URINE PREG, ED    EKG   Radiology No results found.  Procedures Procedures (including critical care time)  Medications Ordered in UC Medications - No data to display  Initial Impression / Assessment and Plan / UC Course  I have reviewed the triage vital signs and the nursing notes.  Pertinent labs & imaging results that were available during my care of the patient were reviewed by me and considered in my medical decision making (see chart for details).     COVID pcr pending, suspect this is cause of sxs particularly given her exposures at home. Discussed phenergan DM, albuterol inhaler, mucinex, supportive home care, return precautions, isolation. Work note given. Urine preg neg, patient informed.   Final Clinical Impressions(s) / UC Diagnoses   Final diagnoses:  Viral URI with cough  Exposure to COVID-19 virus  Urine pregnancy test negative   Discharge Instructions   None    ED Prescriptions    Medication Sig Dispense Auth. Provider   albuterol (VENTOLIN HFA) 108 (90 Base) MCG/ACT inhaler Inhale 1-2 puffs into the lungs every 6 (six) hours as needed for wheezing or shortness of breath. 18 g Roosvelt Maser [place], [place]   promethazine-dextromethorphan (PROMETHAZINE-DM) 6.25-15 MG/5ML syrup Take 5 mLs by mouth 4 (four) times daily as needed for cough. 100 mL Particia Nearing, PA-C   guaiFENesin (MUCINEX) 600 MG 12 hr tablet Take 1 tablet (600 mg total) by mouth 2 (two) times daily as needed. 30 tablet  Particia Nearing, [place]     PDMP not reviewed this encounter.   Particia Nearing, [place] 07/18/20 [phone]

## 2020-07-18 NOTE — ED Triage Notes (Signed)
Pt states that she is still SOB, chest pain, HA, and chills. Pt found out she tested positive for Covid today. Pt states her chest only hurts when she coughs.

## 2020-07-19 NOTE — ED Provider Notes (Signed)
[facility]   [id number] 07/18/20 Arrival Time: 1900  ASSESSMENT & PLAN:  1. COVID-19 virus infection   2. SOB (shortness of breath)   3. Nausea and vomiting, intractability of vomiting not specified, unspecified vomiting type     I have personally viewed the imaging studies ordered this visit. No acute changes on CXR.   Meds ordered this encounter  Medications  . ondansetron (ZOFRAN-ODT) 4 MG disintegrating tablet    Sig: Take 1 tablet (4 mg total) by mouth every 8 (eight) hours as needed for nausea or vomiting.    Dispense:  15 tablet    Refill:  0  . HYDROcodone-homatropine (HYCODAN) 5-1.5 MG/5ML syrup    Sig: Take 5 mLs by mouth every 6 (six) hours as needed for cough.    Dispense:  90 mL    Refill:  0     Follow-up Information    [facility] EMERGENC[facility] DEPART[facility].   Specialty: Emergency Medicine Why: If symptoms worsen in any way. Contact information: [address] [id number] mc [place] [address] [phone]              Reviewed expectations re: course of current medical issues. Questions answered. Outlined signs and symptoms indicating need for more acute intervention. Understanding verbalized. After Visit Summary given.   SUBJECTIVE: History from: patient. Tina Thomas is a 24 y.o. female who tested + for COVID yesterday. "Just feel really bad". Complains of SOB, HA, chills. Coughing bothering her the most. Normal PO intake with mild nausea. No fever reported.   OBJECTIVE:  Vitals:   07/18/20 1907 07/18/20 2015  BP:  (!) 145/84  Pulse: (!) 120 (!) 111  Resp: 17 20  Temp:  99.9 F (37.7 C)  TempSrc:  Oral  SpO2: 100% 97%    General appearance: alert; no distress; appears fatigued Eyes: PERRLA; EOMI; conjunctiva normal HENT: [place]; AT; with nasal congestion Neck: supple  CV: tachycardic Lungs: speaks full sentences without difficulty; unlabored Extremities: no edema Skin: warm and  dry Neurologic: normal gait Psychological: alert and cooperative; normal mood and affect  Labs: Results for orders placed or performed during the hospital encounter of 07/17/20  Resp Panel by RT-PCR (Flu A&B, Covid) Nasopharyngeal Swab   Specimen: Nasopharyngeal Swab; Nasopharyngeal(NP) swabs in vial transport medium  Result Value Ref Range   SARS Coronavirus 2 by RT PCR POSITIVE (A) NEGATIVE   Influenza A by PCR NEGATIVE NEGATIVE   Influenza B by PCR NEGATIVE NEGATIVE  POC urine pregnancy  Result Value Ref Range   Preg Test, Ur NEGATIVE NEGATIVE   Labs Reviewed - No data to display  Imaging: DG Chest 2 View  Result Date: 07/18/2020 CLINICAL DATA:  Short of breath, chest pain, COVID-19 positive EXAM: CHEST - 2 VIEW COMPARISON:  None. FINDINGS: The heart size and mediastinal contours are within normal limits. Both lungs are clear. The visualized skeletal structures are unremarkable. IMPRESSION: No active cardiopulmonary disease. Electronically Signed   By: Sharlet Salina M.D.   On: 07/18/2020 20:36    Allergies  Allergen Reactions  . Compazine [Prochlorperazine] Anxiety    Past Medical History:  Diagnosis Date  . Anxiety   . Sickle cell trait (HCC)   . UTI (urinary tract infection)    Social History   Socioeconomic History  . Marital status: Single    Spouse name: Not on file  . Number of children: Not on file  . Years of education: Not on file  . Highest education  level: Not on file  Occupational History  . Not on file  Tobacco Use  . Smoking status: Former Smoker    Types: Cigarettes  . Smokeless tobacco: Never Used  Substance and Sexual Activity  . Alcohol use: Not Currently    Comment: 1 bottle of wine every two days   . Drug use: No  . Sexual activity: Yes    Birth control/protection: None  Other Topics Concern  . Not on file  Social History Narrative  . Not on file   Social Determinants of Health   Financial Resource Strain: Not on file  Food  Insecurity: No Food Insecurity  . Worried About Programme researcher, broadcasting/film/video in the Last Year: Never true  . Ran Out of Food in the Last Year: Never true  Transportation Needs: No Transportation Needs  . Lack of Transportation (Medical): No  . Lack of Transportation (Non-Medical): No  Physical Activity: Not on file  Stress: Not on file  Social Connections: Not on file  Intimate Partner Violence: Not on file   Family History  Problem Relation Age of Onset  . Hypertension Mother   . Diabetes Sister   . Cancer Maternal Grandmother    Past Surgical History:  Procedure Laterality Date  . DILATION AND CURETTAGE OF UTERUS    . FOOT SURGERY Bilateral      Mardella Layman, MD 07/19/20 [phone]

## 2020-11-09 ENCOUNTER — Emergency Department (HOSPITAL_COMMUNITY): Payer: Self-pay

## 2020-11-09 ENCOUNTER — Encounter (HOSPITAL_COMMUNITY): Payer: Self-pay

## 2020-11-09 ENCOUNTER — Other Ambulatory Visit: Payer: Self-pay

## 2020-11-09 ENCOUNTER — Emergency Department (HOSPITAL_COMMUNITY)
Admission: EM | Admit: 2020-11-09 | Discharge: 2020-11-09 | Disposition: A | Discharge status: Home / Self Care | Payer: Self-pay | Attending: Emergency Medicine | Admitting: Emergency Medicine

## 2020-11-09 DIAGNOSIS — S92405A Nondisplaced unspecified fracture of left great toe, initial encounter for closed fracture: Secondary | ICD-10-CM

## 2020-11-09 DIAGNOSIS — I10 Essential (primary) hypertension: Secondary | ICD-10-CM | POA: Insufficient documentation

## 2020-11-09 DIAGNOSIS — Z87891 Personal history of nicotine dependence: Secondary | ICD-10-CM | POA: Insufficient documentation

## 2020-11-09 DIAGNOSIS — W01198A Fall on same level from slipping, tripping and stumbling with subsequent striking against other object, initial encounter: Secondary | ICD-10-CM | POA: Insufficient documentation

## 2020-11-09 DIAGNOSIS — S92425A Nondisplaced fracture of distal phalanx of left great toe, initial encounter for closed fracture: Secondary | ICD-10-CM | POA: Insufficient documentation

## 2020-11-09 MED ORDER — OXYCODONE-ACETAMINOPHEN 5-325 MG PO TABS: 1.0000 | ORAL_TABLET | Freq: Four times a day (QID) | ORAL | 0 refills | Status: AC | PRN

## 2020-11-09 MED ORDER — IBUPROFEN 800 MG PO TABS
800.0000 mg | ORAL_TABLET | Freq: Once | ORAL | Status: AC
  Administered 2020-11-09: 800 mg via ORAL
  Filled 2020-11-09: qty 1

## 2020-11-09 NOTE — ED Notes (Signed)
An After Visit Summary was printed and given to the patient. Discharge instructions given and no further questions at this time.  

## 2020-11-09 NOTE — ED Triage Notes (Signed)
Pt states she fell and her left big toe is painful and swollen. Pt states she had surgery on it 2 years ago, that a rod and pin were placed and she is worried something might be displaced.

## 2020-11-09 NOTE — Discharge Instructions (Signed)
Follow-up with your foot surgeon.  Recommend hard soled shoe.  Weightbearing as tolerated.  Take Tylenol and Motrin as needed for pain control.  For breakthrough pain can take the prescribed Percocet.
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
## 2020-11-09 NOTE — Progress Notes (Signed)
Orthopedic Tech Progress Note Patient Details:  Tina Thomas 1995-09-07 [id number]  Ortho Devices Type of Ortho Device: Postop shoe/boot Ortho Device/Splint Location: Left Foot Ortho Device/Splint Interventions: Application   Post Interventions Patient Tolerated: Well   [name] 11/09/2020, 3:47 PM

## 2020-12-10 ENCOUNTER — Other Ambulatory Visit: Payer: Self-pay

## 2020-12-10 ENCOUNTER — Emergency Department (HOSPITAL_COMMUNITY)
Admission: EM | Admit: 2020-12-10 | Discharge: 2020-12-10 | Disposition: A | Discharge status: Home / Self Care | Payer: Medicaid Other | Attending: Emergency Medicine | Admitting: Emergency Medicine

## 2020-12-10 ENCOUNTER — Emergency Department (HOSPITAL_COMMUNITY): Payer: Medicaid Other

## 2020-12-10 ENCOUNTER — Encounter (HOSPITAL_COMMUNITY): Payer: Self-pay | Admitting: Emergency Medicine

## 2020-12-10 DIAGNOSIS — Z5321 Procedure and treatment not carried out due to patient leaving prior to being seen by health care provider: Secondary | ICD-10-CM | POA: Insufficient documentation

## 2020-12-10 DIAGNOSIS — M79645 Pain in left finger(s): Secondary | ICD-10-CM | POA: Insufficient documentation

## 2020-12-10 NOTE — ED Notes (Signed)
Called pt x2 for vitals, no response. Multiple attempts from registration, no response.

## 2020-12-10 NOTE — ED Notes (Signed)
Called pt x2 for vitals, no response. °
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
## 2021-03-07 ENCOUNTER — Ambulatory Visit (HOSPITAL_COMMUNITY)
Admission: EM | Admit: 2021-03-07 | Discharge: 2021-03-07 | Disposition: A | Discharge status: Home / Self Care | Payer: Self-pay | Attending: Student | Admitting: Student

## 2021-03-07 ENCOUNTER — Encounter (HOSPITAL_COMMUNITY): Payer: Self-pay | Admitting: Emergency Medicine

## 2021-03-07 ENCOUNTER — Other Ambulatory Visit: Payer: Self-pay

## 2021-03-07 DIAGNOSIS — Z113 Encounter for screening for infections with a predominantly sexual mode of transmission: Secondary | ICD-10-CM | POA: Insufficient documentation

## 2021-03-07 DIAGNOSIS — N760 Acute vaginitis: Secondary | ICD-10-CM | POA: Insufficient documentation

## 2021-03-07 DIAGNOSIS — Z3202 Encounter for pregnancy test, result negative: Secondary | ICD-10-CM | POA: Insufficient documentation

## 2021-03-07 LAB — POC URINE PREG, ED: Preg Test, Ur: NEGATIVE

## 2021-03-07 MED ORDER — DOXYCYCLINE HYCLATE 100 MG PO CAPS: 100.0000 mg | ORAL_CAPSULE | Freq: Two times a day (BID) | ORAL | 0 refills | Status: AC

## 2021-03-07 MED ORDER — CEFTRIAXONE SODIUM 500 MG IJ SOLR
INTRAMUSCULAR | Status: AC
  Filled 2021-03-07: qty 500

## 2021-03-07 MED ORDER — LIDOCAINE HCL (PF) 1 % IJ SOLN
INTRAMUSCULAR | Status: AC
  Filled 2021-03-07: qty 2

## 2021-03-07 MED ORDER — METRONIDAZOLE 500 MG PO TABS: 500.0000 mg | ORAL_TABLET | Freq: Two times a day (BID) | ORAL | 0 refills | Status: DC

## 2021-03-07 MED ORDER — CEFTRIAXONE SODIUM 500 MG IJ SOLR
500.0000 mg | Freq: Once | INTRAMUSCULAR | Status: AC
  Administered 2021-03-07: 500 mg via INTRAMUSCULAR

## 2021-03-07 MED ORDER — FLUCONAZOLE 150 MG PO TABS: 150.0000 mg | ORAL_TABLET | Freq: Every day | ORAL | 0 refills | Status: DC

## 2021-03-07 NOTE — ED Triage Notes (Signed)
Pt is present today with vaginal discharge, slight odor, and vaginal irritation. Pt states that she had sexual intercourse 72 hours ago, took a plan afterwards, and then started to experience her sx.

## 2021-03-07 NOTE — ED Provider Notes (Signed)
[facility]    CSN: [id number] Arrival date & time: 03/07/21  1440      History   Chief Complaint Chief Complaint  Patient presents with   Vaginal Discharge   Vaginal Itching    HPI Tina Thomas is a 25 y.o. female presenting with vaginal discharge x3 days following unprotected intercourse with new female partner and subsequently taking Plan B. Her last STI screen was 2 months ago but she's not sure if her partner has been tested.  Endorses yellow-green vaginal discharge with smell, and external vaginal irritation.  States this reminds her a little bit of BV that she has had in the past.  Denies abdominal pain, back pain, dysuria, hematuria, fever/chills.  HPI  Past Medical History:  Diagnosis Date   Anxiety    Sickle cell trait (HCC)    UTI (urinary tract infection)     Patient Active Problem List   Diagnosis Date Noted   Hypertension 07/29/2017   Pregnancy 07/27/2017   Vaginal delivery 07/27/2017    Past Surgical History:  Procedure Laterality Date   DILATION AND CURETTAGE OF UTERUS     FOOT SURGERY Bilateral     OB History     Gravida  4   Para  1   Term  1   Preterm      AB  2   Living         SAB      IAB  2   Ectopic      Multiple      Live Births               Home Medications    Prior to Admission medications   Medication Sig Start Date End Date Taking? Authorizing Provider  doxycycline (VIBRAMYCIN) 100 MG capsule Take 1 capsule (100 mg total) by mouth 2 (two) times Thomas for 7 days. Only start this antibiotic if positive for chlamydia 03/07/21 03/14/21 Yes Rhys Martini, PA-C  fluconazole (DIFLUCAN) 150 MG tablet Take 1 tablet (150 mg total) by mouth Thomas. -For your yeast infection, start the Diflucan (fluconazole)- Take one pill today (day 1). If you're still having symptoms in 3 days, take the second pill. **Only start this medication if you're positive for yeast, or if you develop yeast infection after antibiotics  03/07/21  Yes Rhys Martini, PA-C  metroNIDAZOLE (FLAGYL) 500 MG tablet Take 1 tablet (500 mg total) by mouth 2 (two) times Thomas. This is the treatment for BV and trichomonas 03/07/21  Yes Rhys Martini, PA-C  acetaminophen (TYLENOL) 500 MG tablet Take 1,000 mg by mouth every 6 (six) hours as needed for mild pain.    [provider]  albuterol (VENTOLIN HFA) 108 (90 Base) MCG/ACT inhaler Inhale 1-2 puffs into the lungs every 6 (six) hours as needed for wheezing or shortness of breath. 07/17/20   Particia Nearing, PA-C  Doxylamine-Pyridoxine ER (BONJESTA) 20-20 MG TBCR Take 20 mg by mouth 2 (two) times Thomas as needed. Start by taking 1 tablet (20 mg) at bedtime and if controls nausea can take 1 tablet (20 mg) in the morning and then 1 tablet (20 mg) every 12 hours 01/12/20   Raelyn Mora, CNM  guaiFENesin (MUCINEX) 600 MG 12 hr tablet Take 1 tablet (600 mg total) by mouth 2 (two) times Thomas as needed. 07/17/20   Particia Nearing, PA-C  HYDROcodone-homatropine [facility]) 5-1.5 MG/5ML syrup Take 5 mLs by mouth every 6 (six) hours as needed for  cough. 07/18/20   Mardella LaymanHagler, Brian, MD  ibuprofen (ADVIL,MOTRIN) 600 MG tablet Take 1 tablet (600 mg total) by mouth every 6 (six) hours. 07/29/17   Pincus LargePhelps, Jazma Y, DO  norethindrone (MICRONOR,CAMILA,ERRIN) 0.35 MG tablet Take 1 tablet (0.35 mg total) by mouth Thomas. 12/02/17   Pincus LargePhelps, Jazma Y, DO  ondansetron (ZOFRAN-ODT) 4 MG disintegrating tablet Take 1 tablet (4 mg total) by mouth every 8 (eight) hours as needed for nausea or vomiting. 07/18/20   Mardella LaymanHagler, Brian, MD  Prenatal Vit-Fe Fumarate-FA (PRENATAL MULTIVITAMIN) TABS tablet Take 1 tablet by mouth Thomas at 12 noon. 07/29/17   Pincus LargePhelps, Jazma Y, DO  Prenatal Vit-Fe Fumarate-FA (PRENATAL VITAMINS) 28-0.8 MG TABS Take 1 tablet by mouth Thomas. 01/12/20   Raelyn Moraawson, Rolitta, CNM    Family History Family History  Problem Relation Age of Onset   Hypertension Mother    Diabetes Sister    Cancer  Maternal Grandmother     Social History Social History   Tobacco Use   Smoking status: Former    Types: Cigarettes   Smokeless tobacco: Never  Substance Use Topics   Alcohol use: Not Currently    Comment: 1 bottle of wine every two days    Drug use: No     Allergies   Compazine [prochlorperazine]   Review of Systems Review of Systems  Constitutional:  Negative for appetite change, chills, diaphoresis, fever and unexpected weight change.  HENT:  Negative for congestion, ear pain, sinus pressure, sinus pain, sneezing, sore throat and trouble swallowing.   Eyes:  Negative for pain and redness.  Respiratory:  Negative for cough, chest tightness and shortness of breath.   Cardiovascular:  Negative for chest pain.  Gastrointestinal:  Negative for abdominal distention, abdominal pain, anal bleeding, blood in stool, constipation, diarrhea, nausea, rectal pain and vomiting.  Genitourinary:  Positive for vaginal discharge. Negative for decreased urine volume, difficulty urinating, dysuria, flank pain, frequency, genital sores, hematuria, menstrual problem, pelvic pain, urgency, vaginal bleeding and vaginal pain.  Musculoskeletal:  Negative for back pain and myalgias.  Skin:  Negative for rash.  Neurological:  Negative for dizziness, light-headedness and headaches.  All other systems reviewed and are negative.   Physical Exam Triage Vital Signs ED Triage Vitals  Enc Vitals Group     BP      Pulse      Resp      Temp      Temp src      SpO2      Weight      Height      Head Circumference      Peak Flow      Pain Score      Pain Loc      Pain Edu?      Excl. in GC?    No data found.  Updated Vital Signs BP (!) 118/101   Pulse 82   Temp 97.9 F (36.6 C)   Resp 18   LMP 02/26/2021 (Approximate)   SpO2 99%   Visual Acuity Right Eye Distance:   Left Eye Distance:   Bilateral Distance:    Right Eye Near:   Left Eye Near:    Bilateral Near:     Physical  Exam Vitals reviewed.  Constitutional:      General: She is not in acute distress.    Appearance: Normal appearance. She is not ill-appearing.  HENT:     Head: Normocephalic and atraumatic.     Mouth/Throat:  Mouth: Mucous membranes are moist.     Comments: Moist mucous membranes Eyes:     Extraocular Movements: Extraocular movements intact.     Pupils: Pupils are equal, round, and reactive to light.  Cardiovascular:     Rate and Rhythm: Normal rate and regular rhythm.     Heart sounds: Normal heart sounds.  Pulmonary:     Effort: Pulmonary effort is normal.     Breath sounds: Normal breath sounds. No wheezing, rhonchi or rales.  Abdominal:     General: Bowel sounds are normal. There is no distension.     Palpations: Abdomen is soft. There is no mass.     Tenderness: There is no abdominal tenderness. There is no right CVA tenderness, left CVA tenderness, guarding or rebound.  Genitourinary:    Comments: deferred Skin:    General: Skin is warm.     Capillary Refill: Capillary refill takes less than 2 seconds.     Comments: Good skin turgor  Neurological:     General: No focal deficit present.     Mental Status: She is alert and oriented to person, place, and time.  Psychiatric:        Mood and Affect: Mood normal.        Behavior: Behavior normal.     UC Treatments / Results  Labs (all labs ordered are listed, but only abnormal results are displayed) Labs Reviewed  POC URINE PREG, ED  CERVICOVAGINAL ANCILLARY ONLY    EKG   Radiology No results found.  Procedures Procedures (including critical care time)  Medications Ordered in UC Medications  cefTRIAXone (ROCEPHIN) injection 500 mg (has no administration in time range)    Initial Impression / Assessment and Plan / UC Course  I have reviewed the triage vital signs and the nursing notes.  Pertinent labs & imaging results that were available during my care of the patient were reviewed by me and considered  in my medical decision making (see chart for details).     This patient is a very pleasant 25 y.o. year old female presenting with vaginal discharge following unprotected intercourse with new partner. Afebrile, nontachycardic, no reproducible abd pain or CVAT. History BV 03/2020  Will send self-swab for G/C, trich, yeast, BV testing. Declines HIV, RPR. Safe sex precautions  Discussed risks and benefits of treating for STIs today versus waiting for test results.  We will proceed with Rocephin for gonorrhea today, and flagyl for BV/trich. I will send doxycycline for chlamydia, but she will not start this yet. Also sent Diflucan in case she develops a yeast infection from antibiotics.  Negative urine pregnancy today.  ED return precautions discussed. Patient verbalizes understanding and agreement.   Coding Level 4 for review of past notes/labs, order and interpretation of labs today, and prescription drug management  Final Clinical Impressions(s) / UC Diagnoses   Final diagnoses:  Vaginitis and vulvovaginitis  Routine screening for STI (sexually transmitted infection)  Negative pregnancy test     Discharge Instructions      -Your pregnancy test was negative today.  Please try to take a home pregnancy test in about 1 week to confirm this.  Pregnancy tests that you buy at [facility] or the dollar store (or similar) are just as good as the test we do here, so no need to come back for this test. -We treated you with a shot of an antibiotic called Rocephin today to cover for gonorrhea. -For bacterial vaginosis and trichomonas, start the antibiotic-Flagyl (metronidazole),  2 pills Thomas for 7 days.  You can take this with food if you have a sensitive stomach.  Avoid alcohol while taking this medication and for 2 days after as this will cause severe nausea and vomiting. -If you're positive for chlamydia: doxycycline twice Thomas for 7 days.  Make sure to wear sunscreen while spending time outside  while on this medication as it can increase your chance of sunburn. You can take this medication with food if you have a sensitive stomach. -If you develop a yeast infection, start the Diflucan (fluconazole)- Take one pill today (day 1). If you're still having symptoms in 3 days, take the second pill. **Only start this medication if you're positive for yeast, or if you develop yeast infection after antibiotics -Seek additional medical attention if new symptoms like abdominal pain, back pain, fever/chills, worsening of the vaginal symptoms despite treatment. -Abstain from intercourse until treatment is complete. -We will try to call you if any of your test results are positive, the results will also go to your MyChart.      ED Prescriptions     Medication Sig Dispense Auth. Provider   doxycycline (VIBRAMYCIN) 100 MG capsule Take 1 capsule (100 mg total) by mouth 2 (two) times Thomas for 7 days. Only start this antibiotic if positive for chlamydia 14 capsule Rhys Martini, PA-C   fluconazole (DIFLUCAN) 150 MG tablet Take 1 tablet (150 mg total) by mouth Thomas. -For your yeast infection, start the Diflucan (fluconazole)- Take one pill today (day 1). If you're still having symptoms in 3 days, take the second pill. **Only start this medication if you're positive for yeast, or if you develop yeast infection after antibiotics 2 tablet Rhys Martini, PA-C   metroNIDAZOLE (FLAGYL) 500 MG tablet Take 1 tablet (500 mg total) by mouth 2 (two) times Thomas. This is the treatment for BV and trichomonas 14 tablet Rhys Martini, PA-C      PDMP not reviewed this encounter.   Rhys Martini, PA-C 03/07/21 1611

## 2021-03-07 NOTE — Discharge Instructions (Addendum)
-  Your pregnancy test was negative today.  Please try to take a home pregnancy test in about 1 week to confirm this.  Pregnancy tests that you buy at [facility] or the dollar store (or similar) are just as good as the test we do here, so no need to come back for this test. -We treated you with a shot of an antibiotic called Rocephin today to cover for gonorrhea. -For bacterial vaginosis and trichomonas, start the antibiotic-Flagyl (metronidazole), 2 pills daily for 7 days.  You can take this with food if you have a sensitive stomach.  Avoid alcohol while taking this medication and for 2 days after as this will cause severe nausea and vomiting. -If you're positive for chlamydia: doxycycline twice daily for 7 days.  Make sure to wear sunscreen while spending time outside while on this medication as it can increase your chance of sunburn. You can take this medication with food if you have a sensitive stomach. -If you develop a yeast infection, start the Diflucan (fluconazole)- Take one pill today (day 1). If you're still having symptoms in 3 days, take the second pill. **Only start this medication if you're positive for yeast, or if you develop yeast infection after antibiotics -Seek additional medical attention if new symptoms like abdominal pain, back pain, fever/chills, worsening of the vaginal symptoms despite treatment. -Abstain from intercourse until treatment is complete. -We will try to call you if any of your test results are positive, the results will also go to your MyChart.

## 2021-03-08 LAB — CERVICOVAGINAL ANCILLARY ONLY
Bacterial Vaginitis (gardnerella): POSITIVE — AB
Candida Glabrata: NEGATIVE
Candida Vaginitis: NEGATIVE
Chlamydia: NEGATIVE
Comment: NEGATIVE
Comment: NEGATIVE
Comment: NEGATIVE
Comment: NEGATIVE
Comment: NEGATIVE
Comment: NORMAL
Neisseria Gonorrhea: NEGATIVE
Trichomonas: NEGATIVE

## 2021-07-06 ENCOUNTER — Encounter: Payer: Self-pay | Admitting: Family Medicine

## 2021-07-06 ENCOUNTER — Telehealth: Payer: Self-pay | Admitting: Family Medicine

## 2021-07-06 NOTE — Telephone Encounter (Signed)
Patient is requesting a Refill on meds for "BV"

## 2021-07-10 ENCOUNTER — Ambulatory Visit: Payer: Medicaid Other

## 2021-07-10 NOTE — Telephone Encounter (Signed)
Call placed back to pt. Spoke with pt. Pt states having vaginal discharge with irritation and odor. Has hx of BV in the past. Pt advised will have to come in for nurse visit for swab  due to not being seen in our office in 1 year. Pt agreeable. Pt scheduled for appt on 12/20 at 220pm. Pt verbalized understanding to date and time of appt.  Judeth Cornfield, RN

## 2021-08-14 ENCOUNTER — Ambulatory Visit (HOSPITAL_COMMUNITY)
Admission: EM | Admit: 2021-08-14 | Discharge: 2021-08-14 | Disposition: A | Discharge status: Home / Self Care | Payer: Medicaid Other | Attending: Family Medicine | Admitting: Family Medicine

## 2021-08-14 ENCOUNTER — Encounter (HOSPITAL_COMMUNITY): Payer: Self-pay

## 2021-08-14 ENCOUNTER — Other Ambulatory Visit: Payer: Self-pay

## 2021-08-14 DIAGNOSIS — Z711 Person with feared health complaint in whom no diagnosis is made: Secondary | ICD-10-CM | POA: Insufficient documentation

## 2021-08-14 DIAGNOSIS — N898 Other specified noninflammatory disorders of vagina: Secondary | ICD-10-CM | POA: Insufficient documentation

## 2021-08-14 MED ORDER — CEFTRIAXONE SODIUM 500 MG IJ SOLR
INTRAMUSCULAR | Status: AC
  Filled 2021-08-14: qty 500

## 2021-08-14 MED ORDER — LIDOCAINE HCL (PF) 1 % IJ SOLN
INTRAMUSCULAR | Status: AC
  Filled 2021-08-14: qty 2

## 2021-08-14 MED ORDER — CEFTRIAXONE SODIUM 500 MG IJ SOLR
500.0000 mg | Freq: Once | INTRAMUSCULAR | Status: AC
  Administered 2021-08-14: 14:00:00 500 mg via INTRAMUSCULAR

## 2021-08-14 MED ORDER — DOXYCYCLINE HYCLATE 100 MG PO CAPS: 100.0000 mg | ORAL_CAPSULE | Freq: Two times a day (BID) | ORAL | 0 refills | Status: DC

## 2021-08-14 NOTE — Discharge Instructions (Signed)

## 2021-08-14 NOTE — ED Provider Notes (Signed)
[facility]   [id number] 08/14/21 Arrival Time: 1218  ASSESSMENT & PLAN:  1. Vaginal discharge   2. Concern about STD in female without diagnosis    Meds ordered this encounter  Medications   doxycycline (VIBRAMYCIN) 100 MG capsule    Sig: Take 1 capsule (100 mg total) by mouth 2 (two) times daily.    Dispense:  14 capsule    Refill:  0   cefTRIAXone (ROCEPHIN) injection 500 mg      Discharge Instructions      You have been given the following today for treatment of suspected gonorrhea and/or chlamydia:  cefTRIAXone (ROCEPHIN) injection 500 mg  Please pick up your prescription for doxycycline 100 mg and begin taking twice daily for the next seven (7) days.  Even though we have treated you today, we have sent testing for sexually transmitted infections. We will notify you of any positive results once they are received. If required, we will prescribe any medications you might need.  Please refrain from all sexual activity for at least the next seven days.     Without s/s of PID.  Labs Reviewed  CERVICOVAGINAL ANCILLARY ONLY    Will notify of any positive results. Instructed to refrain from sexual activity for at least seven days.  Reviewed expectations re: course of current medical issues. Questions answered. Outlined signs and symptoms indicating need for more acute intervention. Patient verbalized understanding. After Visit Summary given.   SUBJECTIVE:  Tina Thomas is a 26 y.o. female who presents with complaint of vaginal discharge. Onset gradual. First noticed  3 d ago . "Feels different from BV or yeast." Describes discharge as thick and opaque; with odor. No specific aggravating or alleviating factors reported. Denies: urinary frequency, dysuria, and gross hematuria. Afebrile. No abdominal or pelvic pain. Normal PO intake wihout n/v. No genital rashes or lesions. Reports that she is sexually active with single female partner. OTC treatment:  none.  Patient's last menstrual period was 06/27/2021.   OBJECTIVE:  Vitals:   08/14/21 1302  BP: 126/73  Pulse: 79  Resp: 18  Temp: 98.4 F (36.9 C)  TempSrc: Oral  SpO2: 99%     General appearance: alert, cooperative, appears stated age and no distress Lungs: unlabored respirations; speaks full sentences without difficulty Back: no CVA tenderness; FROM at waist Abdomen: soft, non-tender GU: deferred Skin: warm and dry Psychological: alert and cooperative; normal mood and affect.   Labs Reviewed  CERVICOVAGINAL ANCILLARY ONLY    Allergies  Allergen Reactions   Compazine [Prochlorperazine] Anxiety    Past Medical History:  Diagnosis Date   Anxiety    Sickle cell trait (HCC)    UTI (urinary tract infection)    Family History  Problem Relation Age of Onset   Hypertension Mother    Diabetes Sister    Cancer Maternal Grandmother    Social History   Socioeconomic History   Marital status: Single    Spouse name: Not on file   Number of children: Not on file   Years of education: Not on file   Highest education level: Not on file  Occupational History   Not on file  Tobacco Use   Smoking status: Former    Types: Cigarettes   Smokeless tobacco: Never  Substance and Sexual Activity   Alcohol use: Not Currently    Comment: 1 bottle of wine every two days    Drug use: No   Sexual activity: Yes    Birth control/protection: None  Other Topics Concern   Not on file  Social History Narrative   Not on file   Social Determinants of Health   Financial Resource Strain: Not on file  Food Insecurity: Not on file  Transportation Needs: Not on file  Physical Activity: Not on file  Stress: Not on file  Social Connections: Not on file  Intimate Partner Violence: Not on file           Vanessa Kick, MD 08/14/21 1330

## 2021-08-14 NOTE — ED Triage Notes (Signed)
Pt c/o vaginal irritation with white discharge and slight odor x3 days.

## 2021-08-15 ENCOUNTER — Telehealth (HOSPITAL_COMMUNITY): Payer: Self-pay | Admitting: Emergency Medicine

## 2021-08-15 LAB — CERVICOVAGINAL ANCILLARY ONLY
Bacterial Vaginitis (gardnerella): POSITIVE — AB
Candida Glabrata: NEGATIVE
Candida Vaginitis: POSITIVE — AB
Chlamydia: NEGATIVE
Comment: NEGATIVE
Comment: NEGATIVE
Comment: NEGATIVE
Comment: NEGATIVE
Comment: NEGATIVE
Comment: NORMAL
Neisseria Gonorrhea: NEGATIVE
Trichomonas: NEGATIVE

## 2021-08-15 MED ORDER — FLUCONAZOLE 150 MG PO TABS: 150.0000 mg | ORAL_TABLET | Freq: Once | ORAL | 0 refills | Status: AC

## 2021-08-15 MED ORDER — METRONIDAZOLE 500 MG PO TABS: 500.0000 mg | ORAL_TABLET | Freq: Two times a day (BID) | ORAL | 0 refills | Status: DC

## 2021-08-30 ENCOUNTER — Encounter (HOSPITAL_COMMUNITY): Payer: Self-pay | Admitting: *Deleted

## 2021-08-30 ENCOUNTER — Emergency Department (HOSPITAL_COMMUNITY): Payer: Medicaid Other

## 2021-08-30 ENCOUNTER — Emergency Department (HOSPITAL_COMMUNITY)
Admission: EM | Admit: 2021-08-30 | Discharge: 2021-08-30 | Discharge status: Against Medical Advice | Payer: Medicaid Other | Attending: Student | Admitting: Student

## 2021-08-30 DIAGNOSIS — M79644 Pain in right finger(s): Secondary | ICD-10-CM | POA: Insufficient documentation

## 2021-08-30 DIAGNOSIS — M79641 Pain in right hand: Secondary | ICD-10-CM | POA: Insufficient documentation

## 2021-08-30 DIAGNOSIS — Z5321 Procedure and treatment not carried out due to patient leaving prior to being seen by health care provider: Secondary | ICD-10-CM | POA: Insufficient documentation

## 2021-08-30 DIAGNOSIS — W228XXA Striking against or struck by other objects, initial encounter: Secondary | ICD-10-CM | POA: Insufficient documentation
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
## 2021-08-30 NOTE — ED Notes (Signed)
Pt decided to leave 

## 2021-10-25 ENCOUNTER — Encounter (HOSPITAL_COMMUNITY): Payer: Self-pay | Admitting: Emergency Medicine

## 2021-10-25 ENCOUNTER — Ambulatory Visit (HOSPITAL_COMMUNITY)
Admission: EM | Admit: 2021-10-25 | Discharge: 2021-10-25 | Disposition: A | Discharge status: Home / Self Care | Payer: Self-pay | Attending: Nurse Practitioner | Admitting: Nurse Practitioner

## 2021-10-25 DIAGNOSIS — N760 Acute vaginitis: Secondary | ICD-10-CM | POA: Insufficient documentation

## 2021-10-25 DIAGNOSIS — B9689 Other specified bacterial agents as the cause of diseases classified elsewhere: Secondary | ICD-10-CM | POA: Insufficient documentation

## 2021-10-25 DIAGNOSIS — Z113 Encounter for screening for infections with a predominantly sexual mode of transmission: Secondary | ICD-10-CM | POA: Insufficient documentation

## 2021-10-25 LAB — POCT URINALYSIS DIPSTICK, ED / UC
Bilirubin Urine: NEGATIVE
Glucose, UA: NEGATIVE mg/dL
Hgb urine dipstick: NEGATIVE
Ketones, ur: NEGATIVE mg/dL
Leukocytes,Ua: NEGATIVE
Nitrite: NEGATIVE
Protein, ur: NEGATIVE mg/dL
Specific Gravity, Urine: 1.02 (ref 1.005–1.030)
Urobilinogen, UA: 0.2 mg/dL (ref 0.0–1.0)
pH: 6.5 (ref 5.0–8.0)

## 2021-10-25 LAB — HIV ANTIBODY (ROUTINE TESTING W REFLEX): HIV Screen 4th Generation wRfx: NONREACTIVE

## 2021-10-25 LAB — POC URINE PREG, ED: Preg Test, Ur: NEGATIVE

## 2021-10-25 MED ORDER — METRONIDAZOLE 500 MG PO TABS: 500.0000 mg | ORAL_TABLET | Freq: Two times a day (BID) | ORAL | 0 refills | Status: AC

## 2021-10-25 NOTE — ED Provider Notes (Signed)
?Tina Thomas ? ? ? ?CSN: [id number] ?Arrival date & time: 10/25/21  1128 ? ? ?  ? ?History   ?Chief Complaint ?Chief Complaint  ?Patient presents with  ? Vaginal Discharge  ? ? ?HPI ?Tina Thomas is a 26 y.o. female.  ? ?Patient is a 26 year old female who presents with vaginal discharge.  She states symptoms started approximately 1 week ago.  She states since the symptoms started she has noticed a change in her discharge, as it has become malodorous.  She describes it as "fishy".  She also describes the discharge as "thick and creamy".  She denies vaginal itching, urinary frequency, urgency, hesitancy, or abdominal pain.  The patient states she has had 2 female partners in the past 90 days, 1 of which is new.  She reports 99% condom use.  Her last menstrual cycle was on 10/16/2021.  She denies any previous history of STI.  Patient would like to get syphilis and HIV testing today as well. ? ? ?Vaginal Discharge ?Associated symptoms: no dysuria   ? ?Past Medical History:  ?Diagnosis Date  ? Anxiety   ? Sickle cell trait ([place])   ? UTI (urinary tract infection)   ? ? ?Patient Active Problem List  ? Diagnosis Date Noted  ? Hypertension 07/29/2017  ? Pregnancy 07/27/2017  ? Vaginal delivery 07/27/2017  ? ? ?Past Surgical History:  ?Procedure Laterality Date  ? DILATION AND CURETTAGE OF UTERUS    ? FOOT SURGERY Bilateral   ? ? ?OB History   ? ? Gravida  ?4  ? Para  ?1  ? Term  ?1  ? Preterm  ?   ? AB  ?2  ? Living  ?   ?  ? ? SAB  ?   ? IAB  ?2  ? Ectopic  ?   ? Multiple  ?   ? Live Births  ?   ?   ?  ?  ? ? ? ?Home Medications   ? ?Prior to Admission medications   ?Medication Sig Start Date End Date Taking? Authorizing Provider  ?metroNIDAZOLE (FLAGYL) 500 MG tablet Take 1 tablet (500 mg total) by mouth 2 (two) times daily for 7 days. 10/25/21 11/01/21 Yes Amol Domanski-Warren, Alda Lea, NP  ?acetaminophen (TYLENOL) 500 MG tablet Take 1,000 mg by mouth every 6 (six) hours as needed for mild pain.    [provider]   ?albuterol (VENTOLIN HFA) 108 (90 Base) MCG/ACT inhaler Inhale 1-2 puffs into the lungs every 6 (six) hours as needed for wheezing or shortness of breath. 07/17/20   Volney American, PA-C  ?doxycycline (VIBRAMYCIN) 100 MG capsule Take 1 capsule (100 mg total) by mouth 2 (two) times daily. 08/14/21   Vanessa Kick, MD  ?Doxylamine-Pyridoxine ER (BONJESTA) 20-20 MG TBCR Take 20 mg by mouth 2 (two) times daily as needed. Start by taking 1 tablet (20 mg) at bedtime and if controls nausea can take 1 tablet (20 mg) in the morning and then 1 tablet (20 mg) every 12 hours 01/12/20   Laury Deep, CNM  ?guaiFENesin (MUCINEX) 600 MG 12 hr tablet Take 1 tablet (600 mg total) by mouth 2 (two) times daily as needed. 07/17/20   Volney American, PA-C  ?ibuprofen (ADVIL,MOTRIN) 600 MG tablet Take 1 tablet (600 mg total) by mouth every 6 (six) hours. 07/29/17   Katheren Shams, DO  ?norethindrone (MICRONOR,CAMILA,ERRIN) 0.35 MG tablet Take 1 tablet (0.35 mg total) by mouth daily. 12/02/17   Luiz Blare  Y, DO  ? ? ?Family History ?Family History  ?Problem Relation Age of Onset  ? Hypertension Mother   ? Diabetes Sister   ? Cancer Maternal Grandmother   ? ? ?Social History ?Social History  ? ?Tobacco Use  ? Smoking status: Former  ?  Types: Cigarettes  ? Smokeless tobacco: Never  ?Substance Use Topics  ? Alcohol use: Not Currently  ?  Comment: 1 bottle of wine every two days   ? Drug use: No  ? ? ? ?Allergies   ?Compazine [prochlorperazine] ? ? ?Review of Systems ?Review of Systems  ?Constitutional: Negative.   ?Respiratory: Negative.    ?Cardiovascular: Negative.   ?Gastrointestinal: Negative.   ?Genitourinary:  Positive for vaginal discharge. Negative for dysuria, flank pain, frequency, hematuria, pelvic pain, urgency, vaginal bleeding and vaginal pain.  ?Skin: Negative.   ?Psychiatric/Behavioral: Negative.    ? ? ?Physical Exam ?Triage Vital Signs ?ED Triage Vitals  ?Enc Vitals Group  ?   BP 10/25/21 1258 (!) 112/52   ?   Pulse Rate 10/25/21 1258 89  ?   Resp 10/25/21 1258 18  ?   Temp 10/25/21 1258 98.3 ?F (36.8 ?C)  ?   Temp src --   ?   SpO2 10/25/21 1258 99 %  ?   Weight --   ?   Height --   ?   Head Circumference --   ?   Peak Flow --   ?   Pain Score 10/25/21 1256 0  ?   Pain Loc --   ?   Pain Edu? --   ?   Excl. in GC? --   ? ?No data found. ? ?Updated Vital Signs ?BP (!) 112/52   Pulse 89   Temp 98.3 ?F (36.8 ?C)   Resp 18   LMP 10/16/2021   SpO2 99%  ? ?Visual Acuity ?Right Eye Distance:   ?Left Eye Distance:   ?Bilateral Distance:   ? ?Right Eye Near:   ?Left Eye Near:    ?Bilateral Near:    ? ?Physical Exam ?Vitals reviewed.  ?Constitutional:   ?   General: She is not in acute distress. ?   Appearance: Normal appearance.  ?HENT:  ?   Head: Normocephalic and atraumatic.  ?Eyes:  ?   Extraocular Movements: Extraocular movements intact.  ?   Pupils: Pupils are equal, round, and reactive to light.  ?Cardiovascular:  ?   Rate and Rhythm: Normal rate and regular rhythm.  ?   Pulses: Normal pulses.  ?   Heart sounds: Normal heart sounds.  ?Pulmonary:  ?   Effort: Pulmonary effort is normal.  ?   Breath sounds: Normal breath sounds.  ?Abdominal:  ?   General: Bowel sounds are normal.  ?   Palpations: Abdomen is soft.  ?   Tenderness: There is no abdominal tenderness.  ?Musculoskeletal:  ?   Cervical back: Normal range of motion.  ?Skin: ?   General: Skin is warm and dry.  ?Neurological:  ?   General: No focal deficit present.  ?   Mental Status: She is alert and oriented to person, place, and time.  ?Psychiatric:     ?   Mood and Affect: Mood normal.     ?   Behavior: Behavior normal.  ? ? ? ?UC Treatments / Results  ?Labs ?(all labs ordered are listed, but only abnormal results are displayed) ?Labs Reviewed  ?HIV ANTIBODY (ROUTINE TESTING W REFLEX)  ?RPR  ?POCT URINALYSIS  DIPSTICK, ED / UC  ?POC URINE PREG, ED  ?CERVICOVAGINAL ANCILLARY ONLY  ? ? ?EKG ? ? ?Radiology ?No results found. ? ?Procedures ?Procedures  (including critical care time) ? ?Medications Ordered in UC ?Medications - No data to display ? ?Initial Impression / Assessment and Plan / UC Course  ?I have reviewed the triage vital signs and the nursing notes. ? ?Pertinent labs & imaging results that were available during my care of the patient were reviewed by me and considered in my medical decision making (see chart for details). ? ?The patient is a 26 year old female who presents for vaginal discharge.  Symptoms have been present for the past week, but she states the characteristics of the discharge change.  Discharge became malodorous, and she described the odor as "fishy" and she describes the discharge as "thick and creamy". Cannot rule out BV at this time. Patient also wants STI testing today as she has had a new sexual partner. Urinalysis and urine pregnancy tests were negative.  We will start the patient on metronidazole.  Patient advised that if her cytology results returned negative, she will be contacted and asked to stop the medication.  Patient advised that if her results are positive, she will be provided treatment.  Follow-up as needed. ?Final Clinical Impressions(s) / UC Diagnoses  ? ?Final diagnoses:  ?BV (bacterial vaginosis)  ?Screening examination for sexually transmitted disease  ? ? ? ?Discharge Instructions   ? ?  ?Your urinalysis and urine pregnancy are negative today.  As discussed, you will be contacted if your cytology or blood work results are positive. ?If your result is positive for BV, you will be asked to stop the medication. ?Continue using condoms for protection. ?Follow-up as needed. ? ? ? ? ?ED Prescriptions   ? ? Medication Sig Dispense Auth. Provider  ? metroNIDAZOLE (FLAGYL) 500 MG tablet Take 1 tablet (500 mg total) by mouth 2 (two) times daily for 7 days. 14 tablet Swara Donze-Warren, Alda Lea, NP  ? ?  ? ?PDMP not reviewed this encounter. ?  ?Tish Men, NP ?10/25/21 1340 ? ?

## 2021-10-25 NOTE — Discharge Instructions (Addendum)
Your urinalysis and urine pregnancy are negative today.  As discussed, you will be contacted if your cytology or blood work results are positive. ?If your result is positive for BV, you will be asked to stop the medication. ?Continue using condoms for protection. ?Follow-up as needed. ?

## 2021-10-25 NOTE — ED Triage Notes (Signed)
Pt is present today with vaginal discharge and odor. Pt sx started one week ago.  ?

## 2021-10-26 LAB — CERVICOVAGINAL ANCILLARY ONLY
Bacterial Vaginitis (gardnerella): POSITIVE — AB
Candida Glabrata: NEGATIVE
Candida Vaginitis: NEGATIVE
Chlamydia: POSITIVE — AB
Comment: NEGATIVE
Comment: NEGATIVE
Comment: NEGATIVE
Comment: NEGATIVE
Comment: NEGATIVE
Comment: NORMAL
Neisseria Gonorrhea: NEGATIVE
Trichomonas: NEGATIVE

## 2021-10-26 LAB — RPR: RPR Ser Ql: NONREACTIVE

## 2021-10-27 ENCOUNTER — Ambulatory Visit (HOSPITAL_COMMUNITY)
Admission: EM | Admit: 2021-10-27 | Discharge: 2021-10-27 | Disposition: A | Discharge status: Home / Self Care | Payer: Medicaid Other | Attending: Nurse Practitioner | Admitting: Nurse Practitioner

## 2021-10-27 ENCOUNTER — Encounter (HOSPITAL_COMMUNITY): Payer: Self-pay | Admitting: *Deleted

## 2021-10-27 ENCOUNTER — Other Ambulatory Visit: Payer: Self-pay

## 2021-10-27 DIAGNOSIS — A749 Chlamydial infection, unspecified: Secondary | ICD-10-CM

## 2021-10-27 MED ORDER — DOXYCYCLINE HYCLATE 100 MG PO CAPS: 100.0000 mg | ORAL_CAPSULE | Freq: Two times a day (BID) | ORAL | 0 refills | Status: AC

## 2021-10-27 NOTE — Discharge Instructions (Addendum)
Please start the treatment both for chlamydia (doxycycline) and BV (Flagyl).  Do not drink alcohol with these medications.  Please use condoms with every sexual encounter. ?

## 2021-10-27 NOTE — ED Provider Notes (Signed)
?[facility] ? ? ? ?CSN: [id number] ?Arrival date & time: 10/27/21  1015 ? ? ?  ? ?History   ?Chief Complaint ?Chief Complaint  ?Patient presents with  ? SEXUALLY TRANSMITTED DISEASE  ? ? ?HPI ?Tina Thomas is a 26 y.o. female.  ? ?Patient presents for STI treatment.  She reports he tested positive for chlamydia.  She also tested positive for BV and has not started that treatment yet.  She denies any new symptoms. ? ? ?Past Medical History:  ?Diagnosis Date  ? Anxiety   ? Sickle cell trait (HCC)   ? UTI (urinary tract infection)   ? ? ?Patient Active Problem List  ? Diagnosis Date Noted  ? Hypertension 07/29/2017  ? Pregnancy 07/27/2017  ? Vaginal delivery 07/27/2017  ? ? ?Past Surgical History:  ?Procedure Laterality Date  ? DILATION AND CURETTAGE OF UTERUS    ? FOOT SURGERY Bilateral   ? ? ?OB History   ? ? Gravida  ?4  ? Para  ?1  ? Term  ?1  ? Preterm  ?   ? AB  ?2  ? Living  ?   ?  ? ? SAB  ?   ? IAB  ?2  ? Ectopic  ?   ? Multiple  ?   ? Live Births  ?   ?   ?  ?  ? ? ? ?Home Medications   ? ?Prior to Admission medications   ?Medication Sig Start Date End Date Taking? Authorizing Provider  ?doxycycline (VIBRAMYCIN) 100 MG capsule Take 1 capsule (100 mg total) by mouth 2 (two) times daily for 7 days. 10/27/21 11/03/21 Yes Valentino Nose, NP  ?acetaminophen (TYLENOL) 500 MG tablet Take 1,000 mg by mouth every 6 (six) hours as needed for mild pain.    [provider]  ?albuterol (VENTOLIN HFA) 108 (90 Base) MCG/ACT inhaler Inhale 1-2 puffs into the lungs every 6 (six) hours as needed for wheezing or shortness of breath. 07/17/20   Particia Nearing, PA-C  ?Doxylamine-Pyridoxine ER (BONJESTA) 20-20 MG TBCR Take 20 mg by mouth 2 (two) times daily as needed. Start by taking 1 tablet (20 mg) at bedtime and if controls nausea can take 1 tablet (20 mg) in the morning and then 1 tablet (20 mg) every 12 hours 01/12/20   Raelyn Mora, CNM  ?guaiFENesin (MUCINEX) 600 MG 12 hr tablet Take 1 tablet  (600 mg total) by mouth 2 (two) times daily as needed. 07/17/20   Particia Nearing, PA-C  ?ibuprofen (ADVIL,MOTRIN) 600 MG tablet Take 1 tablet (600 mg total) by mouth every 6 (six) hours. 07/29/17   Pincus Large, DO  ?metroNIDAZOLE (FLAGYL) 500 MG tablet Take 1 tablet (500 mg total) by mouth 2 (two) times daily for 7 days. 10/25/21 11/01/21  Leath-Warren, Sadie Haber, NP  ?norethindrone (MICRONOR,CAMILA,ERRIN) 0.35 MG tablet Take 1 tablet (0.35 mg total) by mouth daily. 12/02/17   Pincus Large, DO  ? ? ?Family History ?Family History  ?Problem Relation Age of Onset  ? Hypertension Mother   ? Diabetes Sister   ? Cancer Maternal Grandmother   ? ? ?Social History ?Social History  ? ?Tobacco Use  ? Smoking status: Former  ?  Types: Cigarettes  ? Smokeless tobacco: Never  ?Substance Use Topics  ? Alcohol use: Not Currently  ?  Comment: 1 bottle of wine every two days   ? Drug use: No  ? ? ? ?Allergies   ?Compazine [prochlorperazine] ? ? ?  Review of Systems ?Review of Systems ?Per HPI ? ?Physical Exam ?Triage Vital Signs ?ED Triage Vitals  ?Enc Vitals Group  ?   BP 10/27/21 1042 107/76  ?   Pulse Rate 10/27/21 1042 88  ?   Resp 10/27/21 1042 16  ?   Temp 10/27/21 1042 98.4 ?F (36.9 ?C)  ?   Temp src --   ?   SpO2 10/27/21 1042 98 %  ?   Weight --   ?   Height --   ?   Head Circumference --   ?   Peak Flow --   ?   Pain Score 10/27/21 1040 0  ?   Pain Loc --   ?   Pain Edu? --   ?   Excl. in GC? --   ? ?No data found. ? ?Updated Vital Signs ?BP 107/76   Pulse 88   Temp 98.4 ?F (36.9 ?C)   Resp 16   LMP 10/16/2021   SpO2 98%  ? ?Visual Acuity ?Right Eye Distance:   ?Left Eye Distance:   ?Bilateral Distance:   ? ?Right Eye Near:   ?Left Eye Near:    ?Bilateral Near:    ? ?Physical Exam ?Vitals and nursing note reviewed.  ?Pulmonary:  ?   Effort: Pulmonary effort is normal. No respiratory distress.  ?Neurological:  ?   Mental Status: She is oriented to person, place, and time.  ? ? ? ?UC Treatments / Results   ?Labs ?(all labs ordered are listed, but only abnormal results are displayed) ?Labs Reviewed - No data to display ? ?EKG ? ? ?Radiology ?No results found. ? ?Procedures ?Procedures (including critical care time) ? ?Medications Ordered in UC ?Medications - No data to display ? ?Initial Impression / Assessment and Plan / UC Course  ?I have reviewed the triage vital signs and the nursing notes. ? ?Pertinent labs & imaging results that were available during my care of the patient were reviewed by me and considered in my medical decision making (see chart for details). ? ?  ?Discussed accuracy of cytology testing given testing DNA, likely had a false positive is low.  She is agreeable to start treatment for chlamydia-we will send to pharmacy.  Discussed condom use. ?Final Clinical Impressions(s) / UC Diagnoses  ? ?Final diagnoses:  ?Chlamydia  ? ? ? ?Discharge Instructions   ? ?  ?Please start the treatment both for chlamydia (doxycycline) and BV (Flagyl).  Do not drink alcohol with these medications.  Please use condoms with every sexual encounter. ? ? ? ? ?ED Prescriptions   ? ? Medication Sig Dispense Auth. Provider  ? doxycycline (VIBRAMYCIN) 100 MG capsule Take 1 capsule (100 mg total) by mouth 2 (two) times daily for 7 days. 14 capsule Valentino Nose, NP  ? ?  ? ?PDMP not reviewed this encounter. ?  ?Valentino Nose, NP ?10/27/21 1139 ? ?

## 2021-10-27 NOTE — ED Triage Notes (Signed)
T presents today because of a positive test for chlamydia and wants to make sure the result is not a false result. ?

## 2021-12-10 ENCOUNTER — Ambulatory Visit (HOSPITAL_COMMUNITY)
Admission: EM | Admit: 2021-12-10 | Discharge: 2021-12-10 | Disposition: A | Discharge status: Home / Self Care | Payer: Medicaid Other | Attending: Family Medicine | Admitting: Family Medicine

## 2021-12-10 ENCOUNTER — Encounter (HOSPITAL_COMMUNITY): Payer: Self-pay | Admitting: Emergency Medicine

## 2021-12-10 DIAGNOSIS — A084 Viral intestinal infection, unspecified: Secondary | ICD-10-CM | POA: Insufficient documentation

## 2021-12-10 DIAGNOSIS — N760 Acute vaginitis: Secondary | ICD-10-CM | POA: Insufficient documentation

## 2021-12-10 LAB — POCT URINALYSIS DIPSTICK, ED / UC
Bilirubin Urine: NEGATIVE
Glucose, UA: NEGATIVE mg/dL
Hgb urine dipstick: NEGATIVE
Ketones, ur: NEGATIVE mg/dL
Leukocytes,Ua: NEGATIVE
Nitrite: NEGATIVE
Protein, ur: NEGATIVE mg/dL
Specific Gravity, Urine: 1.02 (ref 1.005–1.030)
Urobilinogen, UA: 0.2 mg/dL (ref 0.0–1.0)
pH: 7 (ref 5.0–8.0)

## 2021-12-10 LAB — POC URINE PREG, ED: Preg Test, Ur: NEGATIVE

## 2021-12-10 MED ORDER — ONDANSETRON HCL 4 MG PO TABS: 4.0000 mg | ORAL_TABLET | Freq: Four times a day (QID) | ORAL | 0 refills | Status: DC

## 2021-12-10 MED ORDER — FLUCONAZOLE 150 MG PO TABS: 150.0000 mg | ORAL_TABLET | Freq: Every day | ORAL | 0 refills | Status: DC

## 2021-12-10 MED ORDER — METRONIDAZOLE 500 MG PO TABS: 500.0000 mg | ORAL_TABLET | Freq: Two times a day (BID) | ORAL | 0 refills | Status: DC

## 2021-12-10 NOTE — ED Provider Notes (Signed)
[facility]    CSN: [id number] Arrival date & time: 12/10/21  1846      History   Chief Complaint Chief Complaint  Patient presents with   Abdominal Pain   Emesis   Headache    HPI Tina Thomas is a 26 y.o. female.   HPI Patient presents for evaluation of generalized abdominal pain, diarrhea, headache, fatigue x 3 days. 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 She denies fever. She has experienced nausea with vomiting . Last vomited earlier today. She has mild sore throat although denies any other URI symptoms. She is concern for possible STD. Patient recently had sexual intercourse with a  prior partner who infected her with an STD. She is uncertain if he was treated previously and would like to be screened. Endorses vaginal discharge which is odorous and consistent with the odor of prior  BV infections.  Past Medical History:  Diagnosis Date   Anxiety    Sickle cell trait (HCC)    UTI (urinary tract infection)     Patient Active Problem List   Diagnosis Date Noted   Hypertension 07/29/2017   Pregnancy 07/27/2017   Vaginal delivery 07/27/2017    Past Surgical History:  Procedure Laterality Date   DILATION AND CURETTAGE OF UTERUS     FOOT SURGERY Bilateral     OB History     Gravida  4   Para  1   Term  1   Preterm      AB  2   Living         SAB      IAB  2   Ectopic      Multiple      Live Births               Home Medications    Prior to Admission medications   Medication Sig Start Date End Date Taking? Authorizing Provider  fluconazole (DIFLUCAN) 150 MG tablet Take 1 tablet (150 mg total) by mouth daily. 12/10/21  Yes Bing NeighborsHarris, Bee Marchiano S, FNP  metroNIDAZOLE (FLAGYL) 500 MG tablet Take 1 tablet (500 mg total) by mouth 2 (two) times daily. 12/10/21  Yes Bing NeighborsHarris, Coreyon Nicotra S, FNP  ondansetron (ZOFRAN) 4 MG tablet Take 1 tablet (4 mg total) by mouth every 6 (six) hours.  12/10/21  Yes Bing NeighborsHarris, Willisha Sligar S, FNP  acetaminophen (TYLENOL) 500 MG tablet Take 1,000 mg by mouth every 6 (six) hours as needed for mild pain.    [provider]  albuterol (VENTOLIN HFA) 108 (90 Base) MCG/ACT inhaler Inhale 1-2 puffs into the lungs every 6 (six) hours as needed for wheezing or shortness of breath. 07/17/20   Particia NearingLane, Rachel Elizabeth, PA-C  Doxylamine-Pyridoxine ER (BONJESTA) 20-20 MG TBCR Take 20 mg by mouth 2 (two) times daily as needed. Start by taking 1 tablet (20 mg) at bedtime and if controls nausea can take 1 tablet (20 mg) in the morning and then 1 tablet (20 mg) every 12 hours 01/12/20   Raelyn Moraawson, Rolitta, CNM  guaiFENesin (MUCINEX) 600 MG 12 hr tablet Take 1 tablet (600 mg total) by mouth 2 (two) times daily as needed. 07/17/20   Particia NearingLane, Rachel Elizabeth, PA-C  ibuprofen (ADVIL,MOTRIN) 600 MG tablet Take 1 tablet (600 mg total) by mouth every 6 (six) hours. 07/29/17   Pincus LargePhelps, Jazma Y, DO  norethindrone (MICRONOR,CAMILA,ERRIN) 0.35 MG tablet Take 1 tablet (0.35 mg total) by mouth daily. 12/02/17  Pincus Large, DO    Family History Family History  Problem Relation Age of Onset   Hypertension Mother    Diabetes Sister    Cancer Maternal Grandmother     Social History Social History   Tobacco Use   Smoking status: Former    Types: Cigarettes   Smokeless tobacco: Never  Substance Use Topics   Alcohol use: Not Currently    Comment: 1 bottle of wine every two days    Drug use: No     Allergies   Compazine [prochlorperazine]   Review of Systems Review of Systems Pertinent negatives listed in HPI   Physical Exam Triage Vital Signs ED Triage Vitals  Enc Vitals Group     BP      Pulse      Resp      Temp      Temp src      SpO2      Weight      Height      Head Circumference      Peak Flow      Pain Score      Pain Loc      Pain Edu?      Excl. in [place]?    No data found.  Updated Vital Signs Pulse 86   Temp 98 F (36.7 C)   Resp 18    SpO2 100%   Visual Acuity Right Eye Distance:   Left Eye Distance:   Bilateral Distance:    Right Eye Near:   Left Eye Near:    Bilateral Near:     Physical Exam Constitutional:      Appearance: She is ill-appearing.  HENT:     Head: Normocephalic and atraumatic.     Mouth/Throat:     Mouth: Mucous membranes are moist.  Eyes:     Extraocular Movements: Extraocular movements intact.     Pupils: Pupils are equal, round, and reactive to light.  Cardiovascular:     Rate and Rhythm: Normal rate and regular rhythm.  Pulmonary:     Effort: Pulmonary effort is normal.     Breath sounds: Normal breath sounds and air entry.  Abdominal:     General: Abdomen is flat. Bowel sounds are increased.     Tenderness: There is generalized abdominal tenderness.  Skin:    General: Skin is warm and dry.     Capillary Refill: Capillary refill takes less than 2 seconds.  Neurological:     General: No focal deficit present.     Mental Status: She is alert.  Psychiatric:        Mood and Affect: Mood normal.        Behavior: Behavior normal.    Vaginal Cytology self collected UC Treatments / Results  Labs (all labs ordered are listed, but only abnormal results are displayed) Labs Reviewed  POC URINE PREG, ED  POCT URINALYSIS DIPSTICK, ED / UC  CERVICOVAGINAL ANCILLARY ONLY    EKG   Radiology No results found.  Procedures Procedures (including critical care time)  Medications Ordered in UC Medications - No data to display  Initial Impression / Assessment and Plan / UC Course  I have reviewed the triage vital signs and the nursing notes.  Pertinent labs & imaging results that were available during my care of the patient were reviewed by me and considered in my medical decision making (see chart for details).    STD cytology pending.  Will treat for vaginitis with  metronidazole and Diflucan given patient has a history of recurrent BV per EMR. Viral gastroenteritis prescribed  Zofran for management of nausea.  Patient provided with a work note [place] to rest and force fluids.  Strict return precautions given if symptoms worsen or do not improve. Final Clinical Impressions(s) / UC Diagnoses   Final diagnoses:  Vaginitis and vulvovaginitis  Viral gastroenteritis     Discharge Instructions      Vaginal cytology will result within 24 hours.  If any additional treatment is warranted we will notify you by phone. Take medication as prescribed.  Hydrate well with fluids.  Your work note is attached to the last page of your discharge instructions.     ED Prescriptions     Medication Sig Dispense Auth. Provider   ondansetron (ZOFRAN) 4 MG tablet Take 1 tablet (4 mg total) by mouth every 6 (six) hours. 12 tablet Bing Neighbors, FNP   metroNIDAZOLE (FLAGYL) 500 MG tablet Take 1 tablet (500 mg total) by mouth 2 (two) times daily. 14 tablet Bing Neighbors, FNP   fluconazole (DIFLUCAN) 150 MG tablet Take 1 tablet (150 mg total) by mouth daily. 2 tablet Bing Neighbors, FNP      PDMP not reviewed this encounter.   Bing Neighbors, [place] 12/11/21 [phone]

## 2021-12-10 NOTE — Discharge Instructions (Addendum)
Vaginal cytology will result within 24 hours.  If any additional treatment is warranted we will notify you by phone. Take medication as prescribed.  Hydrate well with fluids.  Your work note is attached to the last page of your discharge instructions.

## 2021-12-10 NOTE — ED Triage Notes (Signed)
Pt is present today with abdominal pain, vomiting, diarrhea, vaginal odor, and vaginal discharge. Pt sx started x3 days ago

## 2021-12-11 LAB — CERVICOVAGINAL ANCILLARY ONLY
Bacterial Vaginitis (gardnerella): POSITIVE — AB
Candida Glabrata: NEGATIVE
Candida Vaginitis: NEGATIVE
Chlamydia: NEGATIVE
Comment: NEGATIVE
Comment: NEGATIVE
Comment: NEGATIVE
Comment: NEGATIVE
Comment: NEGATIVE
Comment: NORMAL
Neisseria Gonorrhea: NEGATIVE
Trichomonas: NEGATIVE

## 2022-01-01 IMAGING — CR DG TOE GREAT 2+V*L*
3 series · 3 of 3 positions shown · non-contrast
Comparison: 12/03/2017

CLINICAL DATA: Left great toe pain and swelling following an
injury. Previous surgery 2 years ago.

EXAM:
LEFT GREAT TOE

[x toes ap left]
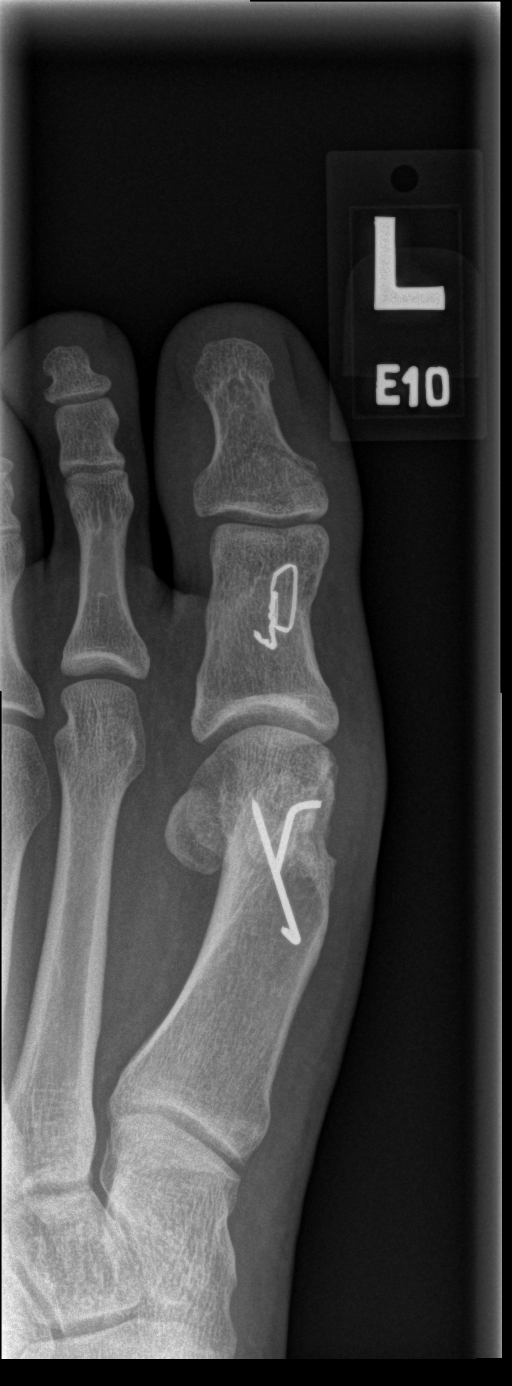

[x toes obl left]
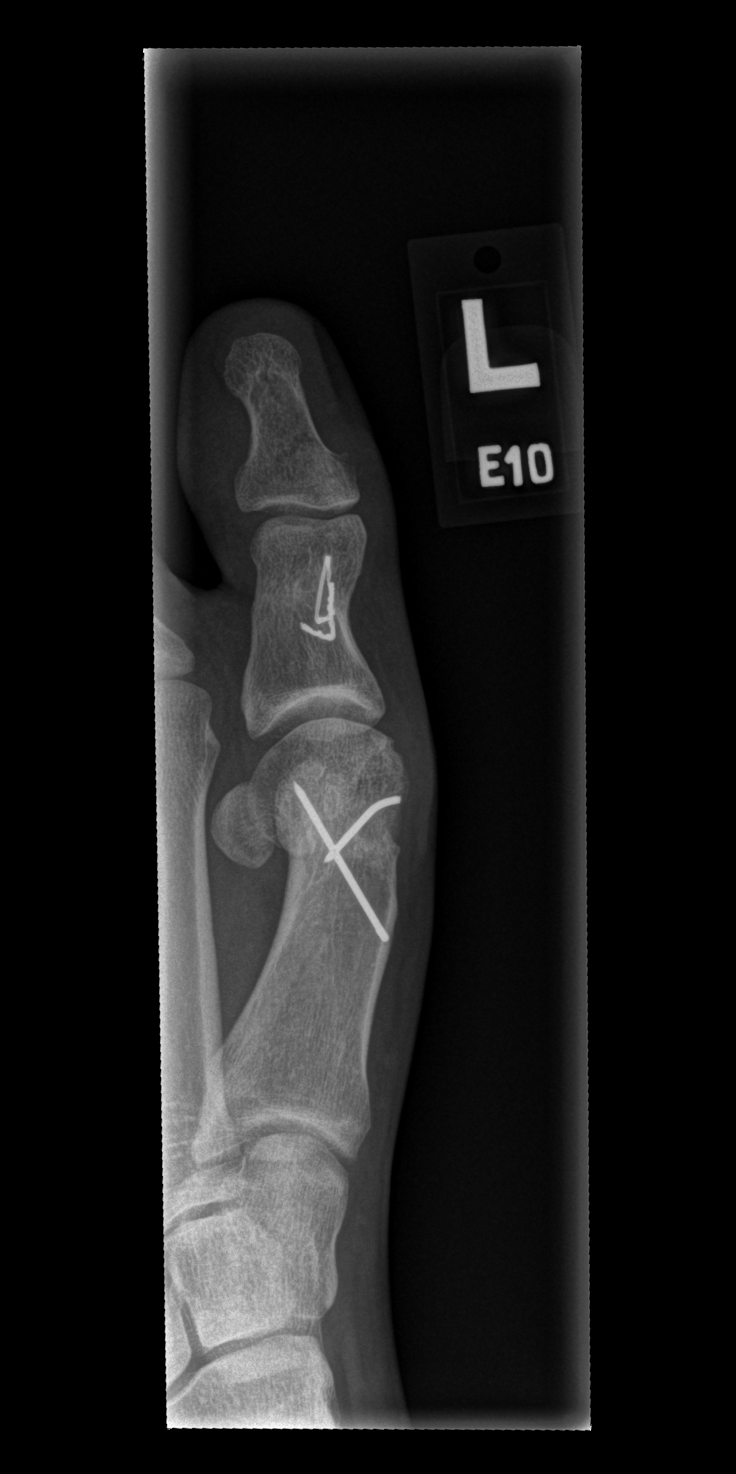

[x toes lat left]
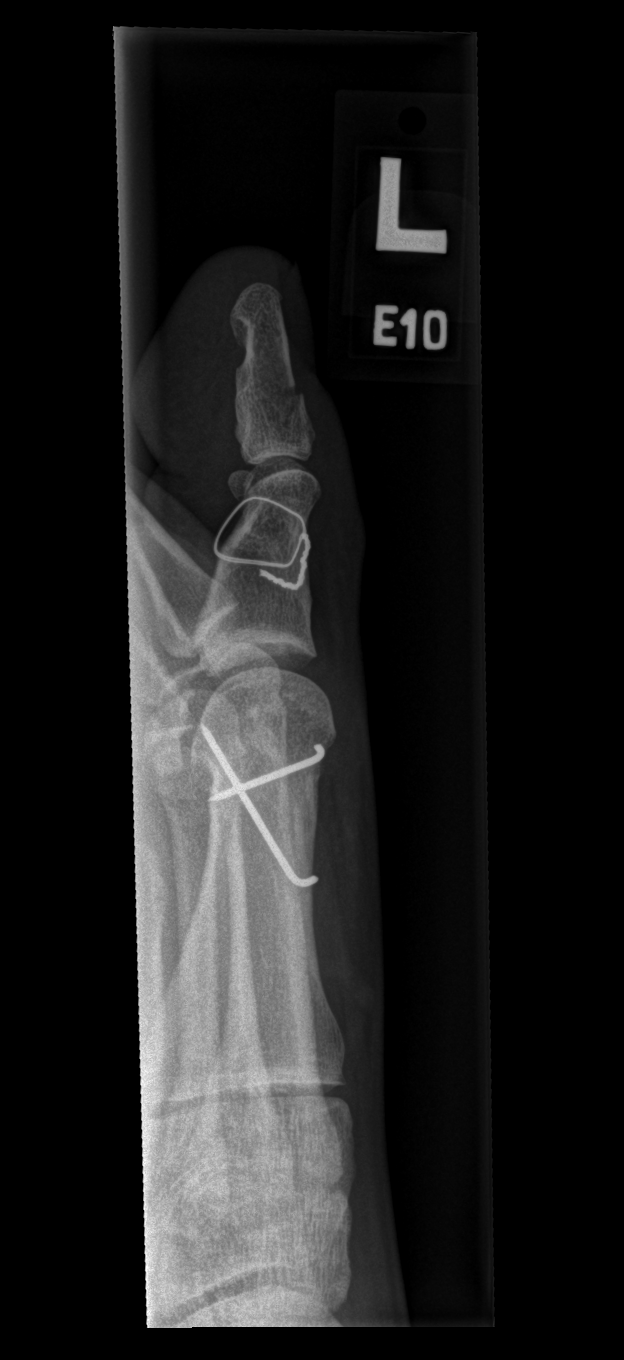

[3 of 3 positions shown; findings below may reference images not displayed]

FINDINGS: Nondisplaced transverse fracture in the 1st distal phalanx with no
intra-articular extension visualized. Wire fixation of the 1st
proximal phalanx. Post bunionectomy changes and K-wires involving
the distal 1st metatarsal.
IMPRESSION: Nondisplaced 1st distal phalanx fracture.

## 2022-02-26 ENCOUNTER — Ambulatory Visit (INDEPENDENT_AMBULATORY_CARE_PROVIDER_SITE_OTHER): Payer: Self-pay

## 2022-02-26 DIAGNOSIS — Z3201 Encounter for pregnancy test, result positive: Secondary | ICD-10-CM

## 2022-02-26 DIAGNOSIS — Z3200 Encounter for pregnancy test, result unknown: Secondary | ICD-10-CM

## 2022-02-26 LAB — POCT PREGNANCY, URINE: Preg Test, Ur: POSITIVE — AB

## 2022-02-26 NOTE — Progress Notes (Signed)
Possible Pregnancy  Here today to leave urine for pregnancy confirmation. UPT in office today is positive. Called pt; VM is full. MyChart message sent.   Marjo Bicker, RN 02/26/2022  2:37 PM

## 2022-03-04 ENCOUNTER — Other Ambulatory Visit: Payer: Self-pay | Admitting: General Practice

## 2022-03-04 DIAGNOSIS — O3680X0 Pregnancy with inconclusive fetal viability, not applicable or unspecified: Secondary | ICD-10-CM

## 2022-03-05 ENCOUNTER — Ambulatory Visit (INDEPENDENT_AMBULATORY_CARE_PROVIDER_SITE_OTHER): Payer: Medicaid Other

## 2022-03-05 DIAGNOSIS — O3680X0 Pregnancy with inconclusive fetal viability, not applicable or unspecified: Secondary | ICD-10-CM | POA: Diagnosis not present

## 2022-03-26 ENCOUNTER — Telehealth: Payer: Medicaid Other

## 2022-04-03 ENCOUNTER — Telehealth (INDEPENDENT_AMBULATORY_CARE_PROVIDER_SITE_OTHER): Payer: Medicaid Other

## 2022-04-03 DIAGNOSIS — Z3480 Encounter for supervision of other normal pregnancy, unspecified trimester: Secondary | ICD-10-CM | POA: Insufficient documentation

## 2022-04-03 DIAGNOSIS — Z3689 Encounter for other specified antenatal screening: Secondary | ICD-10-CM

## 2022-04-03 MED ORDER — PRENATAL PLUS 27-1 MG PO TABS: 1.0000 | ORAL_TABLET | Freq: Every day | ORAL | 11 refills | Status: DC

## 2022-04-03 MED ORDER — GOJJI WEIGHT SCALE MISC: 1.0000 | Freq: Once | 0 refills | Status: AC

## 2022-04-03 MED ORDER — BLOOD PRESSURE MONITORING DEVI: 1.0000 | 0 refills | Status: DC

## 2022-04-03 NOTE — Progress Notes (Signed)
New OB Intake  I connected with  Tina Thomas on 04/03/22 at  8:15 AM EDT by MyChart Video Visit and verified that I am speaking with the correct person using two identifiers. Nurse is located at [facility] and pt is located at [facility].  I discussed the limitations, risks, security and privacy concerns of performing an evaluation and management service by telephone and the availability of in person appointments. I also discussed with the patient that there may be a patient responsible charge related to this service. The patient expressed understanding and agreed to proceed.  I explained I am completing New OB Intake today. We discussed her EDD of 10/23/22 that is based on U/S on 03/05/22. Pt is G6/P1. I reviewed her allergies, medications, Medical/Surgical/OB history, and appropriate screenings. I informed her of [facility] services. [facility] information placed in AVS. Based on history, this is a/an  pregnancy uncomplicated .   Patient Active Problem List   Diagnosis Date Noted   Hypertension 07/29/2017   Pregnancy 07/27/2017   Vaginal delivery 07/27/2017    Concerns addressed today  Delivery Plans Plans to deliver at [facility]. Patient given information for [facility] Healthy Baby website for more information about [facility]. Patient is not interested in water birth. Offered upcoming OB visit with CNM to discuss further.  MyChart/Babyscripts MyChart access verified. I explained pt will have some visits in office and some virtually. Babyscripts instructions given and order placed. Patient verifies receipt of registration text/e-mail. Account successfully created and app downloaded.  Blood Pressure Cuff/Weight Scale Blood pressure cuff ordered for patient to pick-up from [employer]. Explained after first prenatal appt pt will check weekly and document in Babyscripts. Patient does / does not  have weight scale. Weight scale ordered for patient to pick up from [employer].   Anatomy  US Explained first scheduled [place] will be around 19 weeks. Anatomy US scheduled for 05/29/22 at 0930. Pt notified to arrive at 0915.  Labs Discussed Avelina Laine genetic screening with patient. Would like both Panorama and Horizon drawn at new OB visit. Routine prenatal labs needed.  Covid Vaccine Patient has not covid vaccine.   Is patient a CenteringPregnancy candidate?  Accepted Declined due to NA Not a candidate due to NA Centering Patient" indicated on sticky note   Is patient a Mom+Baby Combined Care candidate?  Not a candidate    Scheduled with Mom+Baby provider   Social Determinants of Health Food Insecurity: Patient denies food insecurity. WIC Referral: Patient is interested in referral to [facility].  Transportation: Patient denies transportation needs. Childcare: Discussed no children allowed at ultrasound appointments. Offered childcare services; patient declines childcare services at this time.  First visit review I reviewed new OB appt with pt. I explained she will have a provider visit that includes . Explained pt will be seen by Dr. Nobie Putnam at first visit; encounter routed to appropriate provider. Explained that patient will be seen by pregnancy navigator following visit with provider.   Henrietta Dine, CMA 04/03/2022  8:40 AM

## 2022-04-08 ENCOUNTER — Telehealth: Payer: Self-pay | Admitting: *Deleted

## 2022-04-08 MED ORDER — FAMOTIDINE 20 MG PO TABS: 20.0000 mg | ORAL_TABLET | Freq: Two times a day (BID) | ORAL | 3 refills | Status: DC

## 2022-04-08 NOTE — Telephone Encounter (Signed)
Pt left VM message stating that she has been having bad gas pains and chest burning and hurting like acid reflux. Also she woke up last night with vomiting while having the burning sensation in her chest. She wants to know what she can take. I called pt and advised that her symptoms are most likely related to acid reflux. I stated that I will send in Rx for Pepcid which is over the counter medication but may be covered by her insurance. In addition to the Pepcid, she can take Tums and/or Mylanta liquid according to package directions. Pt voiced understanding.

## 2022-04-09 ENCOUNTER — Encounter: Payer: Medicaid Other | Admitting: Advanced Practice Midwife

## 2022-04-11 ENCOUNTER — Encounter: Payer: Medicaid Other | Admitting: Family Medicine

## 2022-04-22 ENCOUNTER — Telehealth: Payer: Self-pay | Admitting: *Deleted

## 2022-04-22 NOTE — Telephone Encounter (Signed)
[facility] New ob and is set to start centering. I called Anjelique and reviewed New ob appt and Centering appointment. She states she has had transportation issues but now knows she can call Medicaid and has the phone number to call for assistance. She plans to call and request transportation for New ob and Centering appt. Staci Acosta

## 2022-04-26 ENCOUNTER — Encounter: Payer: Medicaid Other | Admitting: Medical

## 2022-04-29 ENCOUNTER — Encounter: Payer: Medicaid Other | Admitting: Advanced Practice Midwife

## 2022-05-01 ENCOUNTER — Encounter: Payer: Self-pay | Admitting: *Deleted

## 2022-05-01 ENCOUNTER — Telehealth: Payer: Self-pay | Admitting: *Deleted

## 2022-05-01 NOTE — Telephone Encounter (Signed)
Called pt and left VM message stating that I am following up on 2 missed appts.  We have rescheduled one of the appts to 10/31 @ 10:55 am and she will have Centering appt on 11/6 as scheduled. Telephone number for Medicaid transportation was again provided in case Tina Thomas is needed. I also stated that I will send a Mychart message and requested that she respond so that I can be sure she has received the information.

## 2022-05-10 ENCOUNTER — Other Ambulatory Visit: Payer: Self-pay

## 2022-05-10 ENCOUNTER — Ambulatory Visit (HOSPITAL_COMMUNITY)
Admission: EM | Admit: 2022-05-10 | Discharge: 2022-05-10 | Disposition: A | Discharge status: Home / Self Care | Payer: Medicaid Other | Attending: Emergency Medicine | Admitting: Emergency Medicine

## 2022-05-10 ENCOUNTER — Encounter (HOSPITAL_COMMUNITY): Payer: Self-pay

## 2022-05-10 DIAGNOSIS — G44209 Tension-type headache, unspecified, not intractable: Secondary | ICD-10-CM

## 2022-05-10 DIAGNOSIS — Z202 Contact with and (suspected) exposure to infections with a predominantly sexual mode of transmission: Secondary | ICD-10-CM | POA: Diagnosis not present

## 2022-05-10 DIAGNOSIS — Y92410 Unspecified street and highway as the place of occurrence of the external cause: Secondary | ICD-10-CM | POA: Diagnosis not present

## 2022-05-10 DIAGNOSIS — O99350 Diseases of the nervous system complicating pregnancy, unspecified trimester: Secondary | ICD-10-CM | POA: Insufficient documentation

## 2022-05-10 LAB — POC URINE PREG, ED: Preg Test, Ur: NEGATIVE

## 2022-05-10 LAB — HIV ANTIBODY (ROUTINE TESTING W REFLEX): HIV Screen 4th Generation wRfx: NONREACTIVE

## 2022-05-10 MED ORDER — ACETAMINOPHEN 325 MG PO TABS
ORAL_TABLET | ORAL | Status: AC
  Filled 2022-05-10: qty 2

## 2022-05-10 MED ORDER — ACETAMINOPHEN 325 MG PO TABS
650.0000 mg | ORAL_TABLET | Freq: Once | ORAL | Status: AC
  Administered 2022-05-10: 650 mg via ORAL

## 2022-05-10 MED ORDER — NAPROXEN 375 MG PO TABS: 375.0000 mg | ORAL_TABLET | Freq: Two times a day (BID) | ORAL | 0 refills | Status: DC

## 2022-05-10 MED ORDER — CEFTRIAXONE SODIUM 500 MG IJ SOLR
INTRAMUSCULAR | Status: AC
  Filled 2022-05-10: qty 500

## 2022-05-10 MED ORDER — CEFTRIAXONE SODIUM 500 MG IJ SOLR
500.0000 mg | Freq: Once | INTRAMUSCULAR | Status: AC
  Administered 2022-05-10: 500 mg via INTRAMUSCULAR
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
## 2022-05-10 NOTE — Discharge Instructions (Addendum)
I have sent naproxen to the pharmacy, you can take 1 tablet every 12 hours as needed for pain.  We treated you for gonorrhea today to the exposure.   The vaginal swab will take 2 to 3 days for the results to come back, if anything is positive we will give you a call. You can view these results on MyChart.   If you begin having any of these symptoms listed bellow, please of to the ED:  Inability to awaken the patient at time of expected wakening Severe or worsening headaches Somnolence or confusion Restlessness, unsteadiness, or seizures Difficulties with vision Vomiting, fever, or stiff neck Urinary or bowel incontinence Weakness or numbness involving any part of the body

## 2022-05-11 LAB — RPR: RPR Ser Ql: NONREACTIVE

## 2022-05-13 ENCOUNTER — Telehealth: Payer: Self-pay | Admitting: Emergency Medicine

## 2022-05-13 LAB — CERVICOVAGINAL ANCILLARY ONLY
Bacterial Vaginitis (gardnerella): POSITIVE — AB
Candida Glabrata: NEGATIVE
Candida Vaginitis: NEGATIVE
Chlamydia: NEGATIVE
Comment: NEGATIVE
Comment: NEGATIVE
Comment: NEGATIVE
Comment: NEGATIVE
Comment: NEGATIVE
Comment: NORMAL
Neisseria Gonorrhea: NEGATIVE
Trichomonas: NEGATIVE

## 2022-05-13 MED ORDER — METRONIDAZOLE 500 MG PO TABS: 500.0000 mg | ORAL_TABLET | Freq: Two times a day (BID) | ORAL | 0 refills | Status: DC

## 2022-05-21 ENCOUNTER — Encounter: Payer: Medicaid Other | Admitting: Advanced Practice Midwife

## 2022-05-22 ENCOUNTER — Encounter: Payer: Medicaid Other | Admitting: Family Medicine

## 2022-05-23 ENCOUNTER — Encounter: Payer: Self-pay | Admitting: *Deleted

## 2022-05-29 ENCOUNTER — Ambulatory Visit: Payer: Medicaid Other | Attending: Family Medicine

## 2022-05-29 ENCOUNTER — Ambulatory Visit: Payer: Medicaid Other

## 2022-06-05 ENCOUNTER — Telehealth: Payer: Self-pay

## 2022-06-05 NOTE — Telephone Encounter (Signed)
Called pt in regards to missed Centering appt.  Pt informed me that she was at work and would prefer to sent MyChart message or to be called after 3pm.  MyChart message sent.    Addison Naegeli, RN  06/05/22

## 2022-06-24 ENCOUNTER — Telehealth: Payer: Self-pay

## 2022-06-24 NOTE — Telephone Encounter (Signed)
Called pt in regards to missed prenatal appts.  Pt reports that she had to terminate her pregnancy because she did not have transportation.  She terminated at 15 weeks.  Pt requests an appt for PCOS.  I verified with pt if she would need virtual due to her transportation concerns.  Pt stated that she would like an in person visit.  Pt informed that the front office will contact her with an appt.  Pt verbalized understanding with no further questions.   Leonette Nutting  06/26/22

## 2022-08-06 ENCOUNTER — Encounter (HOSPITAL_COMMUNITY): Payer: Self-pay | Admitting: Emergency Medicine

## 2022-08-06 ENCOUNTER — Ambulatory Visit (HOSPITAL_COMMUNITY)
Admission: EM | Admit: 2022-08-06 | Discharge: 2022-08-06 | Disposition: A | Discharge status: Home / Self Care | Payer: Medicaid Other | Attending: Emergency Medicine | Admitting: Emergency Medicine

## 2022-08-06 DIAGNOSIS — N898 Other specified noninflammatory disorders of vagina: Secondary | ICD-10-CM | POA: Diagnosis not present

## 2022-08-06 LAB — POC URINE PREG, ED: Preg Test, Ur: NEGATIVE

## 2022-08-06 MED ORDER — METRONIDAZOLE 500 MG PO TABS: 500.0000 mg | ORAL_TABLET | Freq: Two times a day (BID) | ORAL | 0 refills | Status: DC

## 2022-08-06 NOTE — ED Triage Notes (Signed)
Pt reports that has new sexual partner and having vaginal discharge that has foul odor and itching for a week.  Pt states got a phone call saying they were from health department and has been exposed to Herpes. Pt denies pumps, lesions, outbreaks.

## 2022-08-06 NOTE — ED Provider Notes (Signed)
[place]    CSN: [id number] Arrival date & time: 08/06/22  1110      History   Chief Complaint Chief Complaint  Patient presents with   Exposure to STD    HPI Tina Thomas is a 27 y.o. female. Patient presents c/o vaginal discharge that started 1 week ago.  Patient reports a " thin to thick white fishy" discharge.  Patient reports minimal vaginal itching at times.  Patient reports she has had 1 new female sexual partner last month.  She denies any known exposure to a sexually transmitted disease.  Patient reports a history of BV.  She reports that she received a message from the health department stating that she had been exposed to herpes.  Patient denies any lesion or rash on her vulvar area.  Patient has not taken any medications for symptoms.  Patient reports that she is unaware as to when her last menstrual period was.   Exposure to STD Pertinent negatives include no abdominal pain.    Past Medical History:  Diagnosis Date   Anxiety    Sickle cell trait ([place])    UTI (urinary tract infection)     Patient Active Problem List   Diagnosis Date Noted   Supervision of other normal pregnancy, antepartum 04/03/2022   Hypertension 07/29/2017   Pregnancy 07/27/2017   Vaginal delivery 07/27/2017    Past Surgical History:  Procedure Laterality Date   DILATION AND CURETTAGE OF UTERUS     FOOT SURGERY Bilateral     OB History     Gravida  7   Para  1   Term  1   Preterm      AB  4   Living  1      SAB  4   IAB  0   Ectopic      Multiple      Live Births  1            Home Medications    Prior to Admission medications   Medication Sig Start Date End Date Taking? Authorizing Provider  metroNIDAZOLE (FLAGYL) 500 MG tablet Take 1 tablet (500 mg total) by mouth 2 (two) times daily. 08/06/22  Yes Flossie Dibble, NP  acetaminophen (TYLENOL) 500 MG tablet Take 1,000 mg by mouth every 6 (six) hours as needed for mild pain.    [provider]  Blood Pressure Monitoring DEVI 1 each by Does not apply route once a week. 04/03/22   Concepcion Living, MD    Family History Family History  Problem Relation Age of Onset   Hypertension Mother    Diabetes Sister    Cancer Maternal Grandmother     Social History Social History   Tobacco Use   Smoking status: Former    Types: Cigarettes   Smokeless tobacco: Never  Substance Use Topics   Alcohol use: Not Currently    Comment: 1 bottle of wine every two days    Drug use: No     Allergies   Compazine [prochlorperazine]   Review of Systems Review of Systems  Constitutional:  Negative for activity change, appetite change, chills, fatigue and fever.  Gastrointestinal: Negative.  Negative for abdominal pain, nausea and vomiting.  Genitourinary:  Positive for vaginal discharge. Negative for decreased urine volume, difficulty urinating, dyspareunia, dysuria, flank pain, frequency, genital sores, hematuria, menstrual problem, pelvic pain, urgency, vaginal bleeding and vaginal pain.       Intermittent vaginal itching   Skin:  Negative for color change and rash.     Physical Exam Triage Vital Signs ED Triage Vitals  Enc Vitals Group     BP 08/06/22 1130 112/73     Pulse Rate 08/06/22 1130 85     Resp 08/06/22 1130 14     Temp 08/06/22 1130 98.4 F (36.9 C)     Temp Source 08/06/22 1130 Oral     SpO2 08/06/22 1130 97 %     Weight --      Height --      Head Circumference --      Peak Flow --      Pain Score 08/06/22 1128 0     Pain Loc --      Pain Edu? --      Excl. in [place]? --    No data found.  Updated Vital Signs BP 112/73 (BP Location: Left Arm)   Pulse 85   Temp 98.4 F (36.9 C) (Oral)   Resp 14   LMP 10/16/2021   SpO2 97%   Breastfeeding Unknown      Physical Exam Vitals and nursing note reviewed.  Constitutional:      Appearance: Normal appearance.  Genitourinary:    Comments: Deferred exam Neurological:     Mental Status: She is  alert.      UC Treatments / Results  Labs (all labs ordered are listed, but only abnormal results are displayed) Labs Reviewed  POC URINE PREG, ED  CERVICOVAGINAL ANCILLARY ONLY    EKG   Radiology No results found.  Procedures Procedures (including critical care time)  Medications Ordered in UC Medications - No data to display  Initial Impression / Assessment and Plan / UC Course  I have reviewed the triage vital signs and the nursing notes.  Pertinent labs & imaging results that were available during my care of the patient were reviewed by me and considered in my medical decision making (see chart for details).     Patient was evaluated for vaginal discharge.  POC pregnancy was negative.  Based on symptomology, patient is being treated for possible BV infection.  Flagyl antibiotic was sent to the pharmacy.  Patient was made aware of treatment regiment and potential side effects.  Patient was educated on appropriate testing for HSV, she was made aware that if any areas on her skin occur to present for follow-up.  Patient verbalized understanding of instructions.  Charting was provided using a a verbal dictation system, charting was proofread for errors, errors may occur which could change the meaning of the information charted.   Final Clinical Impressions(s) / UC Diagnoses   Final diagnoses:  Vaginal discharge     Discharge Instructions      We will call you if any of your test results warrant a change in your plan of care, you may view these test results on MyChart.  You are being treated for bacterial vaginosis today.  An antibiotic called Flagyl was sent to the pharmacy, you will take this medication twice a day for the next 7 days.  Please refrain from any alcohol use while taking this medication and 72 hours after finishing the antibiotic.  Please finish all of the antibiotic even if your symptoms improve, this is important to clear your infection and to prevent  bacterial resistance.  Your swab has been sent for verification, we will call you if any of your test results are positive.       ED Prescriptions  Medication Sig Dispense Auth. Provider   metroNIDAZOLE (FLAGYL) 500 MG tablet Take 1 tablet (500 mg total) by mouth 2 (two) times daily. 14 tablet Debby Freiberg, NP      PDMP not reviewed this encounter.   Debby Freiberg, NP 08/06/22 [phone]

## 2022-08-06 NOTE — Discharge Instructions (Addendum)
We will call you if any of your test results warrant a change in your plan of care, you may view these test results on MyChart.  You are being treated for bacterial vaginosis today.  An antibiotic called Flagyl was sent to the pharmacy, you will take this medication twice a day for the next 7 days.  Please refrain from any alcohol use while taking this medication and 72 hours after finishing the antibiotic.  Please finish all of the antibiotic even if your symptoms improve, this is important to clear your infection and to prevent bacterial resistance.  Your swab has been sent for verification, we will call you if any of your test results are positive.

## 2022-08-07 ENCOUNTER — Telehealth (HOSPITAL_COMMUNITY): Payer: Self-pay | Admitting: Emergency Medicine

## 2022-08-07 LAB — CERVICOVAGINAL ANCILLARY ONLY
Bacterial Vaginitis (gardnerella): POSITIVE — AB
Candida Glabrata: NEGATIVE
Candida Vaginitis: POSITIVE — AB
Chlamydia: NEGATIVE
Comment: NEGATIVE
Comment: NEGATIVE
Comment: NEGATIVE
Comment: NEGATIVE
Comment: NEGATIVE
Comment: NORMAL
Neisseria Gonorrhea: NEGATIVE
Trichomonas: NEGATIVE

## 2022-08-07 MED ORDER — FLUCONAZOLE 150 MG PO TABS: 150.0000 mg | ORAL_TABLET | Freq: Once | ORAL | 0 refills | Status: AC

## 2022-08-20 ENCOUNTER — Ambulatory Visit: Payer: Medicaid Other | Admitting: Obstetrics and Gynecology

## 2023-03-08 ENCOUNTER — Encounter (HOSPITAL_COMMUNITY): Payer: Self-pay | Admitting: Emergency Medicine

## 2023-03-08 ENCOUNTER — Other Ambulatory Visit: Payer: Self-pay

## 2023-03-08 ENCOUNTER — Emergency Department (EMERGENCY_DEPARTMENT_HOSPITAL)
Admission: EM | Admit: 2023-03-08 | Discharge: 2023-03-09 | Disposition: A | Discharge status: Home / Self Care | Payer: BC Managed Care – PPO | Source: Home / Self Care | Attending: Emergency Medicine | Admitting: Emergency Medicine

## 2023-03-08 DIAGNOSIS — F302 Manic episode, severe with psychotic symptoms: Secondary | ICD-10-CM | POA: Diagnosis not present

## 2023-03-08 DIAGNOSIS — F309 Manic episode, unspecified: Secondary | ICD-10-CM

## 2023-03-08 DIAGNOSIS — F3164 Bipolar disorder, current episode mixed, severe, with psychotic features: Secondary | ICD-10-CM | POA: Diagnosis not present

## 2023-03-08 DIAGNOSIS — F308 Other manic episodes: Secondary | ICD-10-CM | POA: Insufficient documentation

## 2023-03-08 DIAGNOSIS — I951 Orthostatic hypotension: Secondary | ICD-10-CM | POA: Diagnosis not present

## 2023-03-08 LAB — COMPREHENSIVE METABOLIC PANEL
ALT: 30 U/L (ref 0–44)
AST: 31 U/L (ref 15–41)
Albumin: 4.2 g/dL (ref 3.5–5.0)
Alkaline Phosphatase: 51 U/L (ref 38–126)
Anion gap: 10 (ref 5–15)
BUN: 12 mg/dL (ref 6–20)
CO2: 24 mmol/L (ref 22–32)
Calcium: 9.3 mg/dL (ref 8.9–10.3)
Chloride: 104 mmol/L (ref 98–111)
Creatinine, Ser: 0.77 mg/dL (ref 0.44–1.00)
GFR, Estimated: >60 mL/min (ref >60)
Glucose, Bld: 114 mg/dL — ABNORMAL HIGH (ref 70–99)
Potassium: 3.7 mmol/L (ref 3.5–5.1)
Sodium: 138 mmol/L (ref 135–145)
Total Bilirubin: 0.8 mg/dL (ref 0.3–1.2)
Total Protein: 7.1 g/dL (ref 6.5–8.1)

## 2023-03-08 LAB — RAPID URINE DRUG SCREEN, HOSP PERFORMED
Amphetamines: NOT DETECTED
Barbiturates: NOT DETECTED
Benzodiazepines: NOT DETECTED
Cocaine: NOT DETECTED
Opiates: NOT DETECTED
Tetrahydrocannabinol: NOT DETECTED

## 2023-03-08 LAB — ETHANOL: Alcohol, Ethyl (B): 19 mg/dL — ABNORMAL HIGH (ref <10)

## 2023-03-08 LAB — CBC
HCT: 39.9 % (ref 36.0–46.0)
Hemoglobin: 13.3 g/dL (ref 12.0–15.0)
MCH: 28.9 pg (ref 26.0–34.0)
MCHC: 33.3 g/dL (ref 30.0–36.0)
MCV: 86.6 fL (ref 80.0–100.0)
Platelets: 292 10*3/uL (ref 150–400)
RBC: 4.61 MIL/uL (ref 3.87–5.11)
RDW: 12.2 % (ref 11.5–15.5)
WBC: 4.4 10*3/uL (ref 4.0–10.5)
nRBC: 0 % (ref 0.0–0.2)

## 2023-03-08 LAB — SALICYLATE LEVEL: Salicylate Lvl: <7 mg/dL — ABNORMAL LOW (ref 7.0–30.0)

## 2023-03-08 LAB — ACETAMINOPHEN LEVEL: Acetaminophen (Tylenol), Serum: <10 ug/mL — ABNORMAL LOW (ref 10–30)

## 2023-03-08 LAB — PREGNANCY, URINE: Preg Test, Ur: NEGATIVE

## 2023-03-08 MED ORDER — METRONIDAZOLE 500 MG PO TABS
500.0000 mg | ORAL_TABLET | Freq: Three times a day (TID) | ORAL | Status: DC
  Administered 2023-03-08 – 2023-03-09 (×3): 500 mg via ORAL
  Filled 2023-03-08 (×3): qty 1

## 2023-03-08 MED ORDER — ONDANSETRON HCL 4 MG PO TABS: 4.0000 mg | ORAL_TABLET | Freq: Three times a day (TID) | ORAL | Status: DC | PRN

## 2023-03-08 MED ORDER — ATOMOXETINE HCL 25 MG PO CAPS
25.0000 mg | ORAL_CAPSULE | Freq: Every day | ORAL | Status: DC
  Filled 2023-03-08: qty 1

## 2023-03-08 MED ORDER — ACETAMINOPHEN 325 MG PO TABS: 650.0000 mg | ORAL_TABLET | ORAL | Status: DC | PRN

## 2023-03-08 MED ORDER — ATOMOXETINE HCL 25 MG PO CAPS
25.0000 mg | ORAL_CAPSULE | Freq: Every day | ORAL | Status: DC
  Administered 2023-03-09: 25 mg via ORAL
  Filled 2023-03-08: qty 1

## 2023-03-08 MED ORDER — OLANZAPINE 5 MG PO TBDP
7.5000 mg | ORAL_TABLET | Freq: Every day | ORAL | Status: DC
  Administered 2023-03-08: 7.5 mg via ORAL
  Filled 2023-03-08: qty 2

## 2023-03-08 MED ORDER — ARIPIPRAZOLE 5 MG PO TABS
5.0000 mg | ORAL_TABLET | Freq: Every day | ORAL | Status: DC
  Administered 2023-03-08 – 2023-03-09 (×2): 5 mg via ORAL
  Filled 2023-03-08 (×2): qty 1

## 2023-03-08 NOTE — ED Provider Notes (Signed)
[name] Provider Note   CSN: [id number] Arrival date & time: 03/08/23  1321     History  Chief Complaint  Patient presents with   Mental Health Problem    Tina Thomas is a 27 y.o. femalemale.  This is a 27 year old female with a history of bipolar 1 disorder presents emergency department today due to increased racing thoughts.  She denies any suicidal or homicidal ideations.  Denies any delusions.  She says that she does feel like she is a bit paranoid.  Patient was started on aripiprazole,amoxetine 1 week ago.   Mental Health Problem      Home Medications Prior to Admission medications   Medication Sig Start Date End Date Taking? Authorizing Provider  acetaminophen (TYLENOL) 500 MG tablet Take 1,000 mg by mouth every 6 (six) hours as needed for mild pain.    [provider]  ARIPiprazole (ABILIFY) 5 MG tablet Take 5 mg by mouth daily.    [provider]  atomoxetine (STRATTERA) 40 MG capsule Take 40 mg by mouth every morning.    [provider]  Blood Pressure Monitoring DEVI 1 each by Does not apply route once a week. 04/03/22   Cresenzo, Cyndi Lennert, MD  metroNIDAZOLE (FLAGYL) 500 MG tablet Take 1 tablet (500 mg total) by mouth 2 (two) times daily. 08/06/22   Debby Freiberg, NP      Allergies    Compazine [prochlorperazine]    Review of Systems   Review of Systems  Physical Exam Updated Vital Signs BP 114/76   Pulse 94   Temp 98.7 F (37.1 C) (Oral)   Resp 18   SpO2 99%  Physical Exam Vitals reviewed.  HENT:     Head: Normocephalic and atraumatic.  Cardiovascular:     Rate and Rhythm: Normal rate.  Pulmonary:     Breath sounds: Normal breath sounds.  Neurological:     General: No focal deficit present.  Psychiatric:        Mood and Affect: Mood normal.        Behavior: Behavior normal.     Comments: Normal speech, normal appearance.  Patient does not appear to be responding to  internal stimuli.  No suicidal or homicidal ideation     ED Results / Procedures / Treatments   Labs (all labs ordered are listed, but only abnormal results are displayed) Labs Reviewed  COMPREHENSIVE METABOLIC PANEL - Abnormal; Notable for the following components:      Result Value   Glucose, Bld 114 (*)    All other components within normal limits  ETHANOL - Abnormal; Notable for the following components:   Alcohol, Ethyl (B) 19 (*)    All other components within normal limits  SALICYLATE LEVEL - Abnormal; Notable for the following components:   Salicylate Lvl <7.0 (*)    All other components within normal limits  ACETAMINOPHEN LEVEL - Abnormal; Notable for the following components:   Acetaminophen (Tylenol), Serum <10 (*)    All other components within normal limits  CBC  RAPID URINE DRUG SCREEN, HOSP PERFORMED  PREGNANCY, URINE  TSH  HCG, SERUM, QUALITATIVE    EKG EKG Interpretation Date/Time:  Saturday March 08 2023 15:14:18 EDT Ventricular Rate:  86 PR Interval:  137 QRS Duration:  81 QT Interval:  348 QTC Calculation: 417 R Axis:   73  Text Interpretation: Sinus rhythm Atrial premature complex No acute changes Confirmed by Anders Simmonds [phone]) on 03/08/2023 3:17:40  PM  Radiology No results found.  Procedures Procedures    Medications Ordered in ED Medications  OLANZapine zydis (ZYPREXA) disintegrating tablet 7.5 mg (has no administration in time range)  atomoxetine (STRATTERA) capsule 25 mg (has no administration in time range)  acetaminophen (TYLENOL) tablet 650 mg (has no administration in time range)  ondansetron (ZOFRAN) tablet 4 mg (has no administration in time range)  ARIPiprazole (ABILIFY) tablet 5 mg (has no administration in time range)    ED Course/ Medical Decision Making/ A&P                                 Medical Decision Making 27 year old female here today for concern of worsening bipolar 1 symptoms.  Plan-upon my assessment  of the patient, she does not appear to be spearing acute mania symptoms.  She is not responding to internal stimuli, her thought content is normal.  She is forward thinking.  No mystical thinking, or unusual thoughts.  Discussed this with the patient, she wishes to have blood work done, and to be evaluated by TTS here in the emergency department.  She is here entirely voluntary.  Reassessment-patient has been evaluated by TTS.  Medically cleared.  Plan is for inpatient treatment.  Home medications ordered.  Amount and/or Complexity of Data Reviewed Labs: ordered.  Risk OTC drugs. Prescription drug management.           Final Clinical Impression(s) / ED Diagnoses Final diagnoses:  [facility])    Rx / DC Orders ED Discharge Orders     None         Arletha Pili, DO 03/08/23 1616

## 2023-03-08 NOTE — ED Triage Notes (Signed)
Patient presents due to bipolar depression and ADHD. She is paranoid and does not feel like herself. Her medication is not helping, in fact she believes it is making her worse. She has thoughts of going to hell, feels flutters in her stomach, not sleeping well and has changes in PO intake.

## 2023-03-08 NOTE — ED Notes (Signed)
Main lab to add on urine preg

## 2023-03-08 NOTE — Consult Note (Signed)
BH ED ASSESSMENT   Reason for Consult:  Psychiatry evaluation Referring Physician:  ER Physician Patient Identification: Tina Thomas MRN:  [id number] ED Chief Complaint: Bipolar I disorder, single manic episode, severe, with psychosis (HCC)  Diagnosis:  Principal Problem:   Bipolar I disorder, single manic episode, severe, with psychosis [facility])   ED Assessment Time Calculation: Start Time: 1544 Stop Time: 1615 Total Time in Minutes (Assessment Completion): 31   Subjective:   Tina Thomas is a 27 y.o. female patient admitted with Bipolar disorder depressed and ADHD came to the ER accompanied by her sister  [facility] Psychiatry evaluation and treatment.  Patient had a recent Medication changes and reports that since then she has been experiencing Manic like symptoms with Paranoia. Marland Kitchen  HPI:  Patient was seen alert and pleasant smiling and she allowed her senior sister in the room.  Sister, who also suffers from Bipolar states that patient is Bipolar  and is now Paranoid.  She states that patient is not sleeping but is eating more food than she usually eats. Patient then added that recently she has been acting out mean, angry and agitated.  She believes a Electrical engineer is doing witchcraft on her.  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  Privately her sister reports that patient is hearing voices and seeing things but she does not want providers to know and diagnose her with Schizophrenia.  Patient admits to drinking Alcohol occasionally and she drank "A bottle of [place] beer" today.  She also admits to using Cannabis but last use was three weeks ago.  Patient sees a Therapist, sports in the community whom placed her on Aripiprazole 10 MG and Atomoxetine  40 mg for ADHD.  Patient states that since the increase of Aripiprazole to 10 Mg she has been struggling, eating more than she eats , no sleep and she has become angry.  Patient  receives only two hours of sleep at night.  Patient denies SI/HI/A but sees things from the corner of her eyes and  remains Paranoid. We will decrease Atomoxetine to 20 mg and will stop Aripiprazole.  Olanzapine 7.5 mg po at bed time will be started.  We will seek inpatient Psychiatry hospitalization for safety and stabilization. Records will be to faxed  to facilities with available bed.  Past Psychiatric History: Bipolar disorder depressed and ADHD.  One Psychiatry hospitalization at age 52, does not remember diagnosis.  She spent a month in the unit.  Outpatient provider is DR Liborio Nixon.  Risk to Self or Others: Is the patient at risk to self? No Has the patient been a risk to self in the past 6 months? No Has the patient been a risk to self within the distant past? No Is the patient a risk to others? No Has the patient been a risk to others in the past 6 months? No Has the patient been a risk to others within the distant past? No  [place] Scale:  Flowsheet Row ED from 03/08/2023 in [facility] Emergency Department at [facility] ED from 05/10/2022 in [facility] at [facility] ED from 12/10/2021 in [facility] at [facility] RISK CATEGORY No Risk No Risk No Risk       AIMS:  , , ,  ,   ASAM:    Substance Abuse:     Past Medical History:  Past Medical History:  Diagnosis Date   Anxiety    Bipolar 1 disorder, depressed (HCC)    Sickle cell trait (HCC)    UTI (urinary tract infection)     Past Surgical History:  Procedure Laterality Date   DILATION AND CURETTAGE OF UTERUS     FOOT SURGERY Bilateral    Family History:  Family History  Problem Relation Age of Onset   Hypertension Mother    Diabetes Sister    Cancer Maternal Grandmother    Family Psychiatric  History: Father-Bipolar disorder Sister-Bipolar disorder Aunt Schizophrenia Social History:  Social History   Substance and Sexual Activity  Alcohol Use Not  Currently   Comment: 1 bottle of wine every two days      Social History   Substance and Sexual Activity  Drug Use No    Social History   Socioeconomic History   Marital status: Single    Spouse name: Not on file   Number of children: Not on file   Years of education: Not on file   Highest education level: Not on file  Occupational History   Not on file  Tobacco Use   Smoking status: Former    Types: Cigarettes   Smokeless tobacco: Never  Substance and Sexual Activity   Alcohol use: Not Currently    Comment: 1 bottle of wine every two days    Drug use: No   Sexual activity: Yes    Birth control/protection: None  Other Topics Concern   Not on file  Social History Narrative   Not on file   Social Determinants of Health   Financial Resource Strain: Not on file  Food Insecurity: No Food Insecurity (04/11/2020)   Hunger Vital Sign    Worried About Running Out of Food in the Last Year: Never true    Ran Out of Food in the Last Year: Never true  Transportation Needs: No Transportation Needs (04/11/2020)   PRAPARE - Administrator, Civil Service (Medical): No    Lack of Transportation (Non-Medical): No  Physical Activity: Not on file  Stress: Not on file  Social Connections: Not on file   Additional Social History:    Allergies:   Allergies  Allergen Reactions   Compazine [Prochlorperazine] Anxiety    Labs:  Results for orders placed or performed during the hospital encounter of 03/08/23 (from the past 48 hour(s))  Comprehensive metabolic panel     Status: Abnormal   Collection Time: 03/08/23  1:37 PM  Result Value Ref Range   Sodium 138 135 - 145 mmol/L   Potassium 3.7 3.5 - 5.1 mmol/L   Chloride 104 98 - 111 mmol/L   CO2 24 22 - 32 mmol/L   Glucose, Bld 114 (H) 70 - 99 mg/dL    Comment: Glucose reference range applies only to samples taken after fasting for at least 8 hours.   BUN 12 6 - 20 mg/dL   Creatinine, Ser 0.98 0.44 - 1.00 mg/dL    Calcium 9.3 8.9 - 11.9 mg/dL   Total Protein 7.1 6.5 - 8.1 g/dL   Albumin 4.2 3.5 - 5.0 g/dL   AST 31 15 - 41 U/L   ALT 30 0 - 44 U/L   Alkaline Phosphatase 51 38 - 126 U/L   Total Bilirubin 0.8 0.3 - 1.2 mg/dL   GFR, Estimated >14 >78 mL/min    Comment: (NOTE) Calculated using the CKD-EPI Creatinine Equation (2021)    Anion gap 10 5 - 15  Comment: Performed at [facility], [address] [place], [place] [address]  cbc     Status: None   Collection Time: 03/08/23  1:37 PM  Result Value Ref Range   WBC 4.4 4.0 - 10.5 K/uL   RBC 4.61 3.87 - 5.11 MIL/uL   Hemoglobin 13.3 12.0 - 15.0 g/dL   HCT 60.4 54.0 - 98.1 %   MCV 86.6 80.0 - 100.0 fL   MCH 28.9 26.0 - 34.0 pg   MCHC 33.3 30.0 - 36.0 g/dL   RDW 19.1 47.8 - 29.5 %   Platelets 292 150 - 400 K/uL   nRBC 0.0 0.0 - 0.2 %    Comment: Performed at [facility], [address] [place], [place] [address]  Rapid urine drug screen (hospital performed)     Status: None   Collection Time: 03/08/23  2:04 PM  Result Value Ref Range   Opiates NONE DETECTED NONE DETECTED   Cocaine NONE DETECTED NONE DETECTED   Benzodiazepines NONE DETECTED NONE DETECTED   Amphetamines NONE DETECTED NONE DETECTED   Tetrahydrocannabinol NONE DETECTED NONE DETECTED   Barbiturates NONE DETECTED NONE DETECTED    Comment: (NOTE) DRUG SCREEN FOR MEDICAL PURPOSES ONLY.  IF CONFIRMATION IS NEEDED FOR ANY PURPOSE, NOTIFY LAB WITHIN 5 DAYS.  LOWEST DETECTABLE LIMITS FOR URINE DRUG SCREEN Drug Class                     Cutoff (ng/mL) Amphetamine and metabolites    1000 Barbiturate and metabolites    200 Benzodiazepine                 200 Opiates and metabolites        300 Cocaine and metabolites        300 THC                            50 Performed at [facility], [address], [place], [place] [address]   Pregnancy, urine     Status: None   Collection Time: 03/08/23  2:04 PM  Result Value  Ref Range   Preg Test, Ur NEGATIVE NEGATIVE    Comment:        THE SENSITIVITY OF THIS METHODOLOGY IS >25 mIU/mL. Performed at [facility], [address], [place], [place] [address]   Ethanol     Status: Abnormal   Collection Time: 03/08/23  2:27 PM  Result Value Ref Range   Alcohol, Ethyl (B) 19 (H) <10 mg/dL    Comment: (NOTE) Lowest detectable limit for serum alcohol is 10 mg/dL.  For medical purposes only. Performed at [facility], [address], [place], [place] [address]   Salicylate level     Status: Abnormal   Collection Time: 03/08/23  2:27 PM  Result Value Ref Range   Salicylate Lvl <7.0 (L) 7.0 - 30.0 mg/dL    Comment: Performed at [facility], [address], [place], [place] [address]  Acetaminophen level     Status: Abnormal   Collection Time: 03/08/23  2:27 PM  Result Value Ref Range   Acetaminophen (Tylenol), Serum <10 (L) 10 - 30 ug/mL    Comment: (NOTE) Therapeutic concentrations vary significantly. A range of 10-30 ug/mL  may be an effective concentration for many patients. However, some  are best treated at concentrations outside of this range. Acetaminophen concentrations >150 ug/mL  at 4 hours after ingestion  and >50 ug/mL at 12 hours after ingestion are often associated with  toxic reactions.  Performed at [facility], [address], [place], [place] [address]     Current Facility-Administered Medications  Medication Dose Route Frequency Provider Last Rate Last Admin   acetaminophen (TYLENOL) tablet 650 mg  650 mg Oral Q4H PRN Anders Simmonds T, DO       ARIPiprazole (ABILIFY) tablet 5 mg  5 mg Oral Daily Anders Simmonds T, DO       [START ON 03/09/2023] atomoxetine (STRATTERA) capsule 25 mg  25 mg Oral Daily Anders Simmonds T, DO       OLANZapine zydis (ZYPREXA) disintegrating tablet 7.5 mg  7.5 mg Oral QHS Dahlia Byes C, NP       ondansetron (ZOFRAN) tablet 4  mg  4 mg Oral Q8H PRN Arletha Pili, DO       Current Outpatient Medications  Medication Sig Dispense Refill   acetaminophen (TYLENOL) 500 MG tablet Take 1,000 mg by mouth every 6 (six) hours as needed for mild pain.     ARIPiprazole (ABILIFY) 5 MG tablet Take 5 mg by mouth daily.     atomoxetine (STRATTERA) 40 MG capsule Take 40 mg by mouth every morning.     Blood Pressure Monitoring DEVI 1 each by Does not apply route once a week. 1 each 0   metroNIDAZOLE (FLAGYL) 500 MG tablet Take 1 tablet (500 mg total) by mouth 2 (two) times daily. 14 tablet 0    Musculoskeletal: Strength & Muscle Tone: within normal limits Gait & Station: normal Patient leans: Front   Psychiatric Specialty Exam: Presentation  General Appearance:  Casual; Neat  Eye Contact: Good  Speech: Clear and Coherent; Pressured  Speech Volume: Normal  Handedness: Right   Mood and Affect  Mood: Anxious; Euphoric  Affect: Congruent; Full Range   Thought Process  Thought Processes: Coherent; Goal Directed  Descriptions of Associations:Intact  Orientation:Full (Time, Place and Person)  Thought Content:Logical  History of Schizophrenia/Schizoaffective disorder:No data recorded Duration of Psychotic Symptoms:No data recorded Hallucinations:Hallucinations: Visual  Ideas of Reference:Paranoia; Delusions  Suicidal Thoughts:Suicidal Thoughts: No  Homicidal Thoughts:Homicidal Thoughts: No   Sensorium  Memory: Immediate Good; Recent Good; Remote Good  Judgment: Intact  Insight: Present   Executive Functions  Concentration: Fair  Attention Span: Fair  Recall: Fair  Fund of Knowledge: Fair  Language: Good   Psychomotor Activity  Psychomotor Activity: Psychomotor Activity: Restlessness   Assets  Assets: Communication Skills; Desire for Improvement; Financial Resources/Insurance; Housing; Physical Health    Sleep  Sleep: Sleep: Poor   Physical Exam: Physical  Exam Vitals and nursing note reviewed.  Constitutional:      Appearance: Normal appearance.  HENT:     Head: Normocephalic.     Nose: Nose normal.  Cardiovascular:     Rate and Rhythm: Normal rate and regular rhythm.  Pulmonary:     Effort: Pulmonary effort is normal.  Musculoskeletal:        General: Normal range of motion.     Cervical back: Normal range of motion.  Skin:    General: Skin is dry.  Neurological:     Mental Status: She is alert and oriented to person, place, and time.  Psychiatric:        Attention and Perception: She is inattentive. She perceives visual hallucinations.        Mood and Affect: Mood is anxious and elated.  Speech: Speech normal.        Behavior: Behavior is hyperactive. Behavior is cooperative.        Thought Content: Thought content is paranoid and delusional.        Cognition and Memory: Cognition and memory normal.        Judgment: Judgment normal.    Review of Systems  Constitutional: Negative.   HENT: Negative.    Eyes: Negative.   Respiratory: Negative.    Cardiovascular: Negative.   Gastrointestinal: Negative.   Genitourinary: Negative.   Musculoskeletal: Negative.   Skin: Negative.   Neurological: Negative.   Endo/Heme/Allergies: Negative.   Psychiatric/Behavioral:  Positive for hallucinations. The patient is nervous/anxious and has insomnia.    Blood pressure 114/76, pulse 94, temperature 98.7 F (37.1 C), temperature source Oral, resp. rate 18, SpO2 99%, unknown if currently breastfeeding. There is no height or weight on file to calculate BMI.  Medical Decision Making: Patient meets criteria for inpatient hospitalization for treatment and stabilization.  She will benefit from Inpatient Psychiatry treatment and monitoring.  We will decrease Atomoxetine to 20 mg and will stop Aripiprazole.  Olanzapine 7.5 mg po at bed time will be started.  We will seek inpatient Psychiatry hospitalization for safety and stabilization.  Records will be to faxed  to facilities with available bed.  Problem 1: Bipolar disorder,Single Manic episode with Psychotic features.  Disposition:  Admit seek bed placement  Earney Navy, NP-PMHNP-BC 03/08/2023 4:16 PM

## 2023-03-09 ENCOUNTER — Encounter (HOSPITAL_COMMUNITY): Payer: Self-pay | Admitting: Nurse Practitioner

## 2023-03-09 ENCOUNTER — Inpatient Hospital Stay (HOSPITAL_COMMUNITY)
Admission: AD | Admit: 2023-03-09 | Discharge: 2023-03-15 | DRG: 885 | Disposition: A | Discharge status: Home / Self Care | Payer: BC Managed Care – PPO | Source: Intra-hospital | Attending: Psychiatry | Admitting: Psychiatry

## 2023-03-09 ENCOUNTER — Other Ambulatory Visit: Payer: Self-pay

## 2023-03-09 DIAGNOSIS — F312 Bipolar disorder, current episode manic severe with psychotic features: Secondary | ICD-10-CM | POA: Diagnosis not present

## 2023-03-09 DIAGNOSIS — G4700 Insomnia, unspecified: Secondary | ICD-10-CM | POA: Diagnosis present

## 2023-03-09 DIAGNOSIS — Z888 Allergy status to other drugs, medicaments and biological substances status: Secondary | ICD-10-CM

## 2023-03-09 DIAGNOSIS — F17211 Nicotine dependence, cigarettes, in remission: Secondary | ICD-10-CM | POA: Diagnosis present

## 2023-03-09 DIAGNOSIS — R4587 Impulsiveness: Secondary | ICD-10-CM | POA: Diagnosis present

## 2023-03-09 DIAGNOSIS — F909 Attention-deficit hyperactivity disorder, unspecified type: Secondary | ICD-10-CM | POA: Diagnosis present

## 2023-03-09 DIAGNOSIS — D573 Sickle-cell trait: Secondary | ICD-10-CM | POA: Diagnosis present

## 2023-03-09 DIAGNOSIS — E860 Dehydration: Secondary | ICD-10-CM | POA: Diagnosis present

## 2023-03-09 DIAGNOSIS — K5900 Constipation, unspecified: Secondary | ICD-10-CM | POA: Diagnosis present

## 2023-03-09 DIAGNOSIS — Z91148 Patient's other noncompliance with medication regimen for other reason: Secondary | ICD-10-CM

## 2023-03-09 DIAGNOSIS — F3164 Bipolar disorder, current episode mixed, severe, with psychotic features: Principal | ICD-10-CM | POA: Diagnosis present

## 2023-03-09 DIAGNOSIS — I951 Orthostatic hypotension: Secondary | ICD-10-CM | POA: Diagnosis present

## 2023-03-09 DIAGNOSIS — F1010 Alcohol abuse, uncomplicated: Secondary | ICD-10-CM | POA: Diagnosis present

## 2023-03-09 DIAGNOSIS — Z62810 Personal history of physical and sexual abuse in childhood: Secondary | ICD-10-CM | POA: Diagnosis not present

## 2023-03-09 DIAGNOSIS — N760 Acute vaginitis: Secondary | ICD-10-CM | POA: Diagnosis present

## 2023-03-09 DIAGNOSIS — Z818 Family history of other mental and behavioral disorders: Secondary | ICD-10-CM

## 2023-03-09 DIAGNOSIS — F4310 Post-traumatic stress disorder, unspecified: Secondary | ICD-10-CM | POA: Diagnosis present

## 2023-03-09 DIAGNOSIS — Z79899 Other long term (current) drug therapy: Secondary | ICD-10-CM | POA: Diagnosis not present

## 2023-03-09 DIAGNOSIS — Z8249 Family history of ischemic heart disease and other diseases of the circulatory system: Secondary | ICD-10-CM | POA: Diagnosis not present

## 2023-03-09 MED ORDER — DIPHENHYDRAMINE HCL 50 MG/ML IJ SOLN: 50.0000 mg | Freq: Three times a day (TID) | INTRAMUSCULAR | Status: DC | PRN

## 2023-03-09 MED ORDER — ONDANSETRON HCL 4 MG PO TABS
4.0000 mg | ORAL_TABLET | Freq: Three times a day (TID) | ORAL | Status: DC | PRN
  Administered 2023-03-13: 4 mg via ORAL
  Filled 2023-03-09: qty 1

## 2023-03-09 MED ORDER — DIPHENHYDRAMINE HCL 25 MG PO CAPS: 50.0000 mg | ORAL_CAPSULE | Freq: Three times a day (TID) | ORAL | Status: DC | PRN

## 2023-03-09 MED ORDER — ATOMOXETINE HCL 25 MG PO CAPS
25.0000 mg | ORAL_CAPSULE | Freq: Every day | ORAL | Status: DC
  Administered 2023-03-10: 25 mg via ORAL
  Filled 2023-03-09 (×3): qty 1

## 2023-03-09 MED ORDER — OLANZAPINE 15 MG PO TBDP
7.5000 mg | ORAL_TABLET | Freq: Every day | ORAL | Status: DC
  Administered 2023-03-09: 7.5 mg via ORAL
  Filled 2023-03-09: qty 0.5
  Filled 2023-03-09: qty 1
  Filled 2023-03-09: qty 0.5

## 2023-03-09 MED ORDER — ACETAMINOPHEN 325 MG PO TABS
650.0000 mg | ORAL_TABLET | ORAL | Status: DC | PRN
  Administered 2023-03-09: 650 mg via ORAL
  Filled 2023-03-09 (×2): qty 2

## 2023-03-09 MED ORDER — HALOPERIDOL LACTATE 5 MG/ML IJ SOLN: 5.0000 mg | Freq: Three times a day (TID) | INTRAMUSCULAR | Status: DC | PRN

## 2023-03-09 MED ORDER — ALUM & MAG HYDROXIDE-SIMETH 200-200-20 MG/5ML PO SUSP
30.0000 mL | ORAL | Status: DC | PRN
  Administered 2023-03-11: 30 mL via ORAL
  Filled 2023-03-09: qty 30

## 2023-03-09 MED ORDER — METRONIDAZOLE 500 MG PO TABS
500.0000 mg | ORAL_TABLET | Freq: Two times a day (BID) | ORAL | Status: AC
  Administered 2023-03-09 – 2023-03-13 (×9): 500 mg via ORAL
  Filled 2023-03-09 (×2): qty 1
  Filled 2023-03-09 (×2): qty 2
  Filled 2023-03-09 (×4): qty 1
  Filled 2023-03-09: qty 2
  Filled 2023-03-09: qty 1
  Filled 2023-03-09: qty 2
  Filled 2023-03-09 (×2): qty 1

## 2023-03-09 MED ORDER — LORAZEPAM 2 MG/ML IJ SOLN: 2.0000 mg | Freq: Three times a day (TID) | INTRAMUSCULAR | Status: DC | PRN

## 2023-03-09 MED ORDER — LORAZEPAM 1 MG PO TABS: 2.0000 mg | ORAL_TABLET | Freq: Three times a day (TID) | ORAL | Status: DC | PRN

## 2023-03-09 MED ORDER — HALOPERIDOL 5 MG PO TABS: 5.0000 mg | ORAL_TABLET | Freq: Three times a day (TID) | ORAL | Status: DC | PRN

## 2023-03-09 MED ORDER — MAGNESIUM HYDROXIDE 400 MG/5ML PO SUSP: 30.0000 mL | Freq: Every day | ORAL | Status: DC | PRN

## 2023-03-09 NOTE — Consult Note (Signed)
Patient is accepted at [facility] for admission.  Orders are in and accepting doctor is [employer].  Patient will be transferred when bed is ready.

## 2023-03-09 NOTE — Progress Notes (Addendum)
Admission Note:   Patient is a 27 yr female who presents Voluntary in no acute distress for the treatment of Depression, Bi-polar, and medication adjustment.  Pt appears flat and depressed. Pt was calm and cooperative with admission process. Pt presents with passive SI and contracts for safety upon admission. Pt denies AVH.  Patient stated that she feel that her medication that she has been prescribed is " making it worst". Patient stated that the medication causes her to be easily irritated , especially towards her 26 year old daughter. Patient cannot remember the names of the medications at the time of admission.   Skin was assessed and found to be clear of any abnormal marks apart from Tattoos on upper left thigh and bi-lateral inner wrists. PT searched and no contraband found, POC and unit policies explained and understanding verbalized. Consents obtained. Food and fluids offered, and fluids accepted. Pt had no additional questions or concerns.

## 2023-03-09 NOTE — ED Notes (Addendum)
RN s/w [facility] nurse and gave report. Safe transport called to have patient transported to [facility]

## 2023-03-09 NOTE — ED Notes (Signed)
Pt is currently resting.  

## 2023-03-09 NOTE — Group Note (Signed)
Date:  03/09/2023 Time:  9:06 PM  Group Topic/Focus:  Wrap-Up Group:   The focus of this group is to help patients review their daily goal of treatment and discuss progress on daily workbooks.    Participation Level:  Did Not Attend  Participation Quality:  Appropriate  Affect:  Appropriate  Cognitive:  Appropriate  Insight: Appropriate  Engagement in Group:  Engaged  Modes of Intervention:  Education and Exploration  Additional Comments:  Patient attended and participated in group tonight. He reports that the most significant thing that happen for her today was coming to [facility] [employer] [facility]	[place] 03/09/2023, 9:06 PM

## 2023-03-09 NOTE — ED Notes (Signed)
RN called to [facility] to give report RN s/w [place] who transferred call no answer unable to leave VM

## 2023-03-09 NOTE — Progress Notes (Signed)
D) Pt received calm, visible, participating in milieu, and in no acute distress. Pt A & O x4. Pt denies SI, HI, A/ V H, depression, anxiety and c/o pain 6/10 as headache at this time. A) Pt encouraged to drink fluids. Pt encouraged to come to staff with needs. Pt encouraged to attend and participate in groups. Pt encouraged to set reachable goals.  R) Pt remained safe on unit, in no acute distress, will continue to assess.     03/09/23 2000  Psych Admission Type (Psych Patients Only)  Admission Status Voluntary  Psychosocial Assessment  Patient Complaints Anxiety  Eye Contact Fair  Facial Expression Animated;Anxious  Affect Anxious  Speech Logical/coherent  Interaction Assertive  Motor Activity Fidgety  Appearance/Hygiene Unremarkable  Behavior Characteristics Cooperative;Appropriate to situation  Mood Anxious  Thought Process  Coherency WDL  Content WDL  Delusions None reported or observed  Perception WDL  Hallucination None reported or observed  Judgment Limited  Confusion WDL  Danger to Self  Current suicidal ideation? Denies  Danger to Others  Danger to Others None reported or observed

## 2023-03-09 NOTE — ED Notes (Signed)
Pt is moving back to [facility]. I checked pts belongings and confirmed that pt only has a hoodie, sweatpants, and socks in her bag. All belongings will be moved with pt.

## 2023-03-09 NOTE — ED Notes (Signed)
Pts belongings are in locker 41 in TCU because the locker for 37 is broken. Pt has a a book bag with clothes in it and a brown paper bag with clothes in it.

## 2023-03-09 NOTE — Progress Notes (Signed)
[facility] LCSW Progress Note   03/09/2023    1:46 PM  Tina Thomas   [id number]   Type of Contact and Topic:  Psychiatric Bed Placement   Pt accepted to [facility] 507-2    Patient meets inpatient criteria per Dahlia Byes, NP   The attending provider will be Dr. Sherron Flemings   Call report to [phone]    Genevive Bi, RN @ [facility] ED notified.     Pt scheduled  to arrive at [facility] TODAY. Patient's bed will be ready @ 1500.    Damita Dunnings, MSW, LCSW-A  1:47 PM 03/09/2023

## 2023-03-09 NOTE — Tx Team (Signed)
Initial Treatment Plan 03/09/2023 7:00 PM Tina Thomas QMV:[id number]    PATIENT STRESSORS: Other: Requesting medication adjustment     PATIENT STRENGTHS: Communication skills    PATIENT IDENTIFIED PROBLEMS: Medication adjustment  Depression  Irritability                 DISCHARGE CRITERIA:  Adequate post-discharge living arrangements  PRELIMINARY DISCHARGE PLAN: Return to previous living arrangement  PATIENT/FAMILY INVOLVEMENT: This treatment plan has been presented to and reviewed with the patient, Tina Thomas, and/or family member, .  The patient and family have been given the opportunity to ask questions and make suggestions.  [name], RN 03/09/2023, 7:00 PM

## 2023-03-09 NOTE — ED Notes (Signed)
I will update pt vitals when she wakes up.

## 2023-03-09 NOTE — ED Provider Notes (Signed)
Emergency Medicine Observation Re-evaluation Note  Tina Thomas is a 27 y.o. female, seen on rounds today.  Pt initially presented to the ED for complaints of Mental Health Problem Currently, the patient is accepted to [facility] for admit.  Physical Exam  BP 100/62 (BP Location: Left Arm)   Pulse 87   Temp 98.2 F (36.8 C) (Oral)   Resp 16   SpO2 99%  Physical Exam General: Calm Cardiac: Well perfused Lungs: Even respirations Psych: Calm  ED Course / MDM  EKG:EKG Interpretation Date/Time:  Saturday March 08 2023 15:14:18 EDT Ventricular Rate:  86 PR Interval:  137 QRS Duration:  81 QT Interval:  348 QTC Calculation: 417 R Axis:   73  Text Interpretation: Sinus rhythm Atrial premature complex No acute changes Confirmed by Anders Simmonds [phone]) on 03/08/2023 3:17:40 PM  I have reviewed the labs performed to date as well as medications administered while in observation.  Recent changes in the last 24 hours include patient accepted to [facility] under Dr. Sherron Flemings.  Plan  Current plan is for [facility] with bed is ready.    Maia Plan, MD 03/09/23 [phone]

## 2023-03-10 ENCOUNTER — Encounter (HOSPITAL_COMMUNITY): Payer: Self-pay

## 2023-03-10 DIAGNOSIS — F312 Bipolar disorder, current episode manic severe with psychotic features: Secondary | ICD-10-CM | POA: Diagnosis not present

## 2023-03-10 MED ORDER — ARIPIPRAZOLE 5 MG PO TABS
5.0000 mg | ORAL_TABLET | Freq: Every day | ORAL | Status: DC
  Administered 2023-03-10: 5 mg via ORAL
  Filled 2023-03-10 (×3): qty 1

## 2023-03-10 MED ORDER — HYDROXYZINE HCL 25 MG PO TABS
25.0000 mg | ORAL_TABLET | Freq: Three times a day (TID) | ORAL | Status: DC | PRN
  Administered 2023-03-13: 25 mg via ORAL
  Filled 2023-03-10: qty 1

## 2023-03-10 MED ORDER — TRAZODONE HCL 100 MG PO TABS
100.0000 mg | ORAL_TABLET | Freq: Every evening | ORAL | Status: DC | PRN
  Administered 2023-03-10: 100 mg via ORAL
  Filled 2023-03-10: qty 1

## 2023-03-10 MED ORDER — OLANZAPINE 7.5 MG PO TABS
7.5000 mg | ORAL_TABLET | Freq: Every day | ORAL | Status: DC
  Filled 2023-03-10 (×2): qty 1

## 2023-03-10 NOTE — Plan of Care (Signed)
  Problem: Education: Goal: Emotional status will improve Outcome: Progressing Goal: Mental status will improve Outcome: Progressing   Problem: Coping: Goal: Ability to verbalize frustrations and anger appropriately will improve Outcome: Progressing Goal: Ability to demonstrate self-control will improve Outcome: Progressing   
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
## 2023-03-10 NOTE — BH IP Treatment Plan (Signed)
Interdisciplinary Treatment and Diagnostic Plan Update  03/10/2023 Time of Session: 1030am Tina Thomas MRN: [id number]  Principal Diagnosis: Bipolar affective disorder, currently manic, severe, with psychotic features (HCC)  Secondary Diagnoses: Principal Problem:   Bipolar affective disorder, currently manic, severe, with psychotic features (HCC)   Current Medications:  Current Facility-Administered Medications  Medication Dose Route Frequency Provider Last Rate Last Admin   acetaminophen (TYLENOL) tablet 650 mg  650 mg Oral Q4H PRN Dahlia Byes C, NP   650 mg at 03/09/23 1956   alum & mag hydroxide-simeth (MAALOX/MYLANTA) 200-200-20 MG/5ML suspension 30 mL  30 mL Oral Q4H PRN Dahlia Byes C, NP       atomoxetine (STRATTERA) capsule 25 mg  25 mg Oral Daily Dahlia Byes C, NP   25 mg at 03/10/23 4332   diphenhydrAMINE (BENADRYL) capsule 50 mg  50 mg Oral TID PRN Earney Navy, NP       Or   diphenhydrAMINE (BENADRYL) injection 50 mg  50 mg Intramuscular TID PRN Dahlia Byes C, NP       haloperidol (HALDOL) tablet 5 mg  5 mg Oral TID PRN Dahlia Byes C, NP       Or   haloperidol lactate (HALDOL) injection 5 mg  5 mg Intramuscular TID PRN Dahlia Byes C, NP       LORazepam (ATIVAN) tablet 2 mg  2 mg Oral TID PRN Dahlia Byes C, NP       Or   LORazepam (ATIVAN) injection 2 mg  2 mg Intramuscular TID PRN Dahlia Byes C, NP       magnesium hydroxide (MILK OF MAGNESIA) suspension 30 mL  30 mL Oral Daily PRN Dahlia Byes C, NP       metroNIDAZOLE (FLAGYL) tablet 500 mg  500 mg Oral Q12H Onuoha, Josephine C, NP   500 mg at 03/10/23 0843   OLANZapine (ZYPREXA) tablet 7.5 mg  7.5 mg Oral QHS Massengill, Nathan, MD       ondansetron (ZOFRAN) tablet 4 mg  4 mg Oral Q8H PRN Dahlia Byes C, NP       PTA Medications: Medications Prior to Admission  Medication Sig Dispense Refill Last Dose   ARIPiprazole (ABILIFY) 5 MG tablet Take 5 mg by  mouth daily.      atomoxetine (STRATTERA) 40 MG capsule Take 40 mg by mouth every morning.      metroNIDAZOLE (FLAGYL) 500 MG tablet Take 1 tablet (500 mg total) by mouth 2 (two) times daily. 14 tablet 0     Patient Stressors: Other: Requesting medication adjustment    Patient Strengths: Communication skills   Treatment Modalities: Medication Management, Group therapy, Case management,  1 to 1 session with clinician, Psychoeducation, Recreational therapy.   Physician Treatment Plan for Primary Diagnosis: Bipolar affective disorder, currently manic, severe, with psychotic features (HCC) Long Term Goal(s):     Short Term Goals:    Medication Management: Evaluate patient's response, side effects, and tolerance of medication regimen.  Therapeutic Interventions: 1 to 1 sessions, Unit Group sessions and Medication administration.  Evaluation of Outcomes: Progressing  Physician Treatment Plan for Secondary Diagnosis: Principal Problem:   Bipolar affective disorder, currently manic, severe, with psychotic features (HCC)  Long Term Goal(s):     Short Term Goals:       Medication Management: Evaluate patient's response, side effects, and tolerance of medication regimen.  Therapeutic Interventions: 1 to 1 sessions, Unit Group sessions and Medication administration.  Evaluation of Outcomes: Progressing   RN  Treatment Plan for Primary Diagnosis: Bipolar affective disorder, currently manic, severe, with psychotic features (HCC) Long Term Goal(s): Knowledge of disease and therapeutic regimen to maintain health will improve  Short Term Goals: Ability to remain free from injury will improve, Ability to verbalize frustration and anger appropriately will improve, Ability to demonstrate self-control, Ability to participate in decision making will improve, Ability to verbalize feelings will improve, Ability to disclose and discuss suicidal ideas, Ability to identify and develop effective coping  behaviors will improve, and Compliance with prescribed medications will improve  Medication Management: RN will administer medications as ordered by provider, will assess and evaluate patient's response and provide education to patient for prescribed medication. RN will report any adverse and/or side effects to prescribing provider.  Therapeutic Interventions: 1 on 1 counseling sessions, Psychoeducation, Medication administration, Evaluate responses to treatment, Monitor vital signs and CBGs as ordered, Perform/monitor CIWA, COWS, AIMS and Fall Risk screenings as ordered, Perform wound care treatments as ordered.  Evaluation of Outcomes: Progressing   LCSW Treatment Plan for Primary Diagnosis: Bipolar affective disorder, currently manic, severe, with psychotic features (HCC) Long Term Goal(s): Safe transition to appropriate next level of care at discharge, Engage patient in therapeutic group addressing interpersonal concerns.  Short Term Goals: Engage patient in aftercare planning with referrals and resources, Increase social support, Increase ability to appropriately verbalize feelings, Increase emotional regulation, Facilitate acceptance of mental health diagnosis and concerns, Facilitate patient progression through stages of change regarding substance use diagnoses and concerns, Identify triggers associated with mental health/substance abuse issues, and Increase skills for wellness and recovery  Therapeutic Interventions: Assess for all discharge needs, 1 to 1 time with Social worker, Explore available resources and support systems, Assess for adequacy in community support network, Educate family and significant other(s) on suicide prevention, Complete Psychosocial Assessment, Interpersonal group therapy.  Evaluation of Outcomes: Progressing   Progress in Treatment: Attending groups: Yes. Participating in groups: Yes. Taking medication as prescribed: Yes. Toleration medication:  Yes. Family/Significant other contact made: No, will contact:  once consent is made Patient understands diagnosis: Yes. Discussing patient identified problems/goals with staff: Yes. Medical problems stabilized or resolved: Yes. Denies suicidal/homicidal ideation: Yes. Issues/concerns per patient self-inventory: Yes. Other:   New problem(s) identified: No, Describe:  none reported  New Short Term/Long Term Goal(s):medication management for mood stabilization  Patient Goals:  Medication management, work on mood and coping skills,development of comprehensive mental wellness/sobriety plan  Discharge Plan or Barriers: Patient recently admitted. CSW will continue to follow and assess for appropriate referrals and possible discharge planning.  Reason for Continuation of Hospitalization: Anxiety Mania Medication stabilization  Estimated Length of Stay:3-5 days  Last 3 [place] Suicide Severity Risk Score: Flowsheet Row [facility] (Current) [facility] from 03/08/2023 in [facility] ED from 05/10/2022 in [facility] RISK CATEGORY No Risk No Risk No Risk       Last PHQ 2/9 Scores:    04/03/2022    8:51 AM 04/11/2020    2:44 PM 09/11/2017    2:52 PM  Depression screen PHQ 2/9  Decreased Interest 0 0 0  Down, Depressed, Hopeless 0 0 0  PHQ - 2 Score 0 0 0  Altered sleeping 0 0 0  Tired, decreased energy 0 0 0  Change in appetite 0 0 1  Feeling bad or failure about yourself  0 0 0  Trouble concentrating 0 0  0  Moving slowly or fidgety/restless 0 0 0  Suicidal thoughts 0 0 0  PHQ-9 Score 0 0 1    Scribe for Treatment Team: Charise Killian 03/10/2023 1:13 PM
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
## 2023-03-10 NOTE — Group Note (Signed)
Date:  03/10/2023 Time:  6:05 PM  Group Topic/Focus:  Goals Group:   The focus of this group is to help patients establish daily goals to achieve during treatment and discuss how the patient can incorporate goal setting into their daily lives to aide in recovery.    Participation Level:  Did Not Attend   Tina Thomas 03/10/2023, 6:05 PM
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
## 2023-03-10 NOTE — Progress Notes (Signed)
   03/10/23 0750  Psych Admission Type (Psych Patients Only)  Admission Status Voluntary  Psychosocial Assessment  Patient Complaints Anxiety  Eye Contact Fair  Facial Expression Animated  Affect Anxious  Speech Logical/coherent  Interaction Assertive  Motor Activity Other (Comment) (WDL)  Appearance/Hygiene Unremarkable  Behavior Characteristics Cooperative;Appropriate to situation  Mood Anxious;Pleasant  Thought Process  Coherency WDL  Content WDL  Delusions None reported or observed  Perception WDL  Hallucination None reported or observed  Judgment Limited  Confusion None  Danger to Self  Current suicidal ideation? Denies  Danger to Others  Danger to Others None reported or observed

## 2023-03-10 NOTE — Group Note (Signed)
Recreation Therapy Group Note   Group Topic:Coping Skills  Group Date: 03/10/2023 Start Time: 1005 End Time: 1046 Facilitators: Ripken Rekowski-McCall, LRT,CTRS Location: [address] Dayroom   Goal Area(s) Addresses: Patient will define what a coping skill is. Patient will create a list of healthy coping skills beginning with each letter of the alphabet. Patient will successfully identify positive coping skills they can use post d/c.  Patient will acknowledge benefit(s) of using learned coping skills post d/c.  Group Description: Coping A to Z. Patient asked to identify what a coping skill is and when they use them. Patients with Clinical research associate discussed healthy versus unhealthy coping skills. Next patients were given a blank worksheet titled "Coping Skills A-Z". Patients were instructed to come up with at least one positive coping skill per letter of the alphabet. Patients were given 15 minutes to brainstorm, before ideas were presented to the large group. Patients and LRT debriefed on the importance of coping skill selection based on situation and back-up plans when a skill tried is not effective. At the end of group, patients were given an handout of alphabetized strategies to keep for future reference.   Affect/Mood: Appropriate   Participation Level: Engaged   Participation Quality: Independent   Behavior: Appropriate   Speech/Thought Process: Focused   Insight: Good   Judgement: Good   Modes of Intervention: Music and Worksheet   Patient Response to Interventions:  Engaged   Education Outcome:  Acknowledges education   Clinical Observations/Individualized Feedback: Pt was attentive and engaged during group session. Pt identified a coping skills as "something that helps you deal with triggers". Pt also explained when faced with a situation, more often than not a negative coping skill is used. Pt used the example of someone being angry and reacting in a negative way. For the  worksheet, pt came up with some coping skills such as talking to someone, soul searching, nature walk, open up, pray, exercise and cry it out.    Plan: Continue to engage patient in RT group sessions 2-3x/week.   Tupac Jeffus-McCall, LRT,CTRS 03/10/2023 11:09 AM

## 2023-03-11 DIAGNOSIS — F312 Bipolar disorder, current episode manic severe with psychotic features: Secondary | ICD-10-CM | POA: Diagnosis not present

## 2023-03-11 LAB — HEMOGLOBIN A1C
Hgb A1c MFr Bld: 5.6 % (ref 4.8–5.6)
Mean Plasma Glucose: 114.02 mg/dL

## 2023-03-11 LAB — URINALYSIS, ROUTINE W REFLEX MICROSCOPIC
Bilirubin Urine: NEGATIVE
Glucose, UA: NEGATIVE mg/dL
Hgb urine dipstick: NEGATIVE
Ketones, ur: NEGATIVE mg/dL
Leukocytes,Ua: NEGATIVE
Nitrite: NEGATIVE
Protein, ur: NEGATIVE mg/dL
Specific Gravity, Urine: 1.015 (ref 1.005–1.030)
pH: 7 (ref 5.0–8.0)

## 2023-03-11 LAB — LIPID PANEL
Cholesterol: 124 mg/dL (ref 0–200)
HDL: 68 mg/dL (ref >40)
LDL Cholesterol: 45 mg/dL (ref 0–99)
Total CHOL/HDL Ratio: 1.8 ratio
Triglycerides: 55 mg/dL (ref <150)
VLDL: 11 mg/dL (ref 0–40)

## 2023-03-11 LAB — TSH: TSH: 0.472 u[IU]/mL (ref 0.350–4.500)

## 2023-03-11 MED ORDER — ENSURE ENLIVE PO LIQD
237.0000 mL | Freq: Three times a day (TID) | ORAL | Status: DC
  Administered 2023-03-12 – 2023-03-15 (×9): 237 mL via ORAL
  Filled 2023-03-11 (×15): qty 237

## 2023-03-11 MED ORDER — TRAZODONE HCL 100 MG PO TABS
200.0000 mg | ORAL_TABLET | Freq: Every evening | ORAL | Status: DC | PRN
  Administered 2023-03-11 – 2023-03-12 (×2): 200 mg via ORAL
  Filled 2023-03-11 (×2): qty 2

## 2023-03-11 MED ORDER — LAMOTRIGINE 25 MG PO TABS
25.0000 mg | ORAL_TABLET | Freq: Every day | ORAL | Status: DC
  Administered 2023-03-11 – 2023-03-13 (×3): 25 mg via ORAL
  Filled 2023-03-11 (×6): qty 1

## 2023-03-11 MED ORDER — ARIPIPRAZOLE 10 MG PO TABS
10.0000 mg | ORAL_TABLET | Freq: Every day | ORAL | Status: DC
  Administered 2023-03-12 – 2023-03-15 (×4): 10 mg via ORAL
  Filled 2023-03-11 (×5): qty 1

## 2023-03-11 NOTE — Plan of Care (Signed)
°  Problem: Education: °Goal: Emotional status will improve °Outcome: Progressing °Goal: Mental status will improve °Outcome: Progressing °  °Problem: Activity: °Goal: Interest or engagement in activities will improve °Outcome: Progressing °  °

## 2023-03-11 NOTE — Progress Notes (Signed)
Cooperative, med compliant, and attending groups. Patient given maalox prn due to upset stomach which provided relief. No s/s of current distress.   03/11/23 0839  Psych Admission Type (Psych Patients Only)  Admission Status Voluntary  Psychosocial Assessment  Patient Complaints Malaise ([place] upset)  Eye Contact Fair  Facial Expression Animated  Affect Appropriate to circumstance  Speech Logical/coherent  Interaction Assertive  Motor Activity Other (Comment)  Appearance/Hygiene Unremarkable  Behavior Characteristics Cooperative;Appropriate to situation  Mood Pleasant;Euthymic  Thought Process  Coherency WDL  Content WDL  Delusions None reported or observed  Perception WDL  Hallucination None reported or observed  Judgment Limited  Confusion None  Danger to Self  Current suicidal ideation? Denies  Agreement Not to Harm Self Yes  Description of Agreement verbal  Danger to Others  Danger to Others None reported or observed

## 2023-03-11 NOTE — Progress Notes (Signed)
   03/10/23 2120  Psych Admission Type (Psych Patients Only)  Admission Status Voluntary  Psychosocial Assessment  Patient Complaints None  Eye Contact Fair  Facial Expression Animated  Affect Anxious  Speech Logical/coherent  Interaction Assertive  Motor Activity Other (Comment)  Appearance/Hygiene Unremarkable  Behavior Characteristics Cooperative;Appropriate to situation  Mood Anxious  Thought Process  Coherency WDL  Content WDL  Delusions None reported or observed  Perception WDL  Hallucination None reported or observed  Judgment Limited  Confusion None  Danger to Self  Current suicidal ideation? Denies  Danger to Others  Danger to Others None reported or observed

## 2023-03-11 NOTE — Progress Notes (Signed)
   03/11/23 2111  Psych Admission Type (Psych Patients Only)  Admission Status Voluntary  Psychosocial Assessment  Patient Complaints Anxiety  Eye Contact Fair  Facial Expression Animated;Anxious  Affect Appropriate to circumstance  Speech Logical/coherent  Interaction Assertive  Motor Activity Other (Comment) (WDL)  Appearance/Hygiene Unremarkable  Behavior Characteristics Cooperative;Appropriate to situation  Mood Pleasant;Euthymic  Thought Process  Coherency WDL  Content WDL  Delusions None reported or observed  Perception WDL  Hallucination None reported or observed  Judgment Limited  Confusion None  Danger to Self  Current suicidal ideation? Denies  Agreement Not to Harm Self Yes  Description of Agreement verbal  Danger to Others  Danger to Others None reported or observed

## 2023-03-11 NOTE — [facility] Group Notes (Signed)

## 2023-03-11 NOTE — Progress Notes (Signed)
   03/11/23 0527  15 Minute Checks  Location Bedroom  Visual Appearance Calm  Behavior Sleeping  Sleep (Behavioral Health Patients Only)  Calculate sleep? (Click Yes once per 24 hr at 0600 safety check) Yes  Documented sleep last 24 hours 5

## 2023-03-11 NOTE — Plan of Care (Signed)
  Problem: Education: Goal: Knowledge of [facility] Education information/materials will improve Outcome: Progressing   Problem: Education: Goal: Emotional status will improve Outcome: Progressing   Problem: Activity: Goal: Sleeping patterns will improve Outcome: Progressing   Problem: Coping: Goal: Ability to verbalize frustrations and anger appropriately will improve Outcome: Progressing   Problem: Coping: Goal: Ability to verbalize frustrations and anger appropriately will improve Outcome: Progressing   Problem: Coping: Goal: Ability to demonstrate self-control will improve Outcome: Progressing   Problem: Safety: Goal: Periods of time without injury will increase Outcome: Progressing   Problem: Self-Concept: Goal: Level of anxiety will decrease Outcome: Progressing

## 2023-03-11 NOTE — Group Note (Signed)
Recreation Therapy Group Note   Group Topic:Animal Assisted Therapy   Group Date: 03/11/2023 Start Time: 0945 End Time: 1030 Facilitators: Renesmee Raine-McCall, LRT,CTRS Location: [address] Dayroom   Animal-Assisted Activity (AAA) Program Checklist/Progress Notes Patient Eligibility Criteria Checklist & Daily Group note for Rec Tx Intervention  AAA/T Program Assumption of Risk Form signed by Patient/ or Parent Legal Guardian Yes  Patient is free of allergies or severe asthma Yes  Patient reports no fear of animals Yes  Patient reports no history of cruelty to animals Yes  Patient understands his/her participation is voluntary Yes  Patient washes hands before animal contact Yes  Patient washes hands after animal contact Yes   Affect/Mood: Appropriate   Participation Level: Engaged   Participation Quality: Independent   Behavior: Appropriate    Clinical Observations/Individualized Feedback: Patient attended session and interacted appropriately with therapy dog and peers. Patient asked appropriate questions about therapy dog and his training. Patient shared stories about their pets at home with group.     Plan: Continue to engage patient in RT group sessions 2-3x/week.   Tyrece Vanterpool-McCall, LRT,CTRS 03/11/2023 12:49 PM
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
## 2023-03-11 NOTE — Group Note (Signed)
[facility] LCSW Group Therapy Note   Group Date: 03/11/2023 Start Time: 1100 End Time: 1140   Type of Therapy/Topic:  Group Therapy:  Emotion Regulation  Participation Level:  Active   Mood:  Description of Group:    The purpose of this group is to assist patients in learning to regulate negative emotions and experience positive emotions. Patients will be guided to discuss ways in which they have been vulnerable to their negative emotions. These vulnerabilities will be juxtaposed with experiences of positive emotions or situations, and patients challenged to use positive emotions to combat negative ones. Special emphasis will be placed on coping with negative emotions in conflict situations, and patients will process healthy conflict resolution skills.  Therapeutic Goals: Patient will identify two positive emotions or experiences to reflect on in order to balance out negative emotions:  Patient will label two or more emotions that they find the most difficult to experience:  Patient will be able to demonstrate positive conflict resolution skills through discussion or role plays:   Summary of Patient Progress:   Patient was able to communicate her feelings and was receptive to feedback. Patient remained in group the entire time.     Therapeutic Modalities:   Cognitive Behavioral Therapy Feelings Identification Dialectical Behavioral Therapy   Starleen Arms, LCSW

## 2023-03-12 DIAGNOSIS — F312 Bipolar disorder, current episode manic severe with psychotic features: Secondary | ICD-10-CM | POA: Diagnosis not present

## 2023-03-12 MED ORDER — POLYETHYLENE GLYCOL 3350 17 G PO PACK: 17.0000 g | PACK | Freq: Every day | ORAL | Status: DC | PRN

## 2023-03-12 NOTE — Group Note (Signed)
Date:  03/12/2023 Time:  11:28 AM  Group Topic/Focus:  Goals Group:   The focus of this group is to help patients establish daily goals to achieve during treatment and discuss how the patient can incorporate goal setting into their daily lives to aide in recovery.    Participation Level:  Minimal  Participation Quality:  Attentive  Affect:  Appropriate  Cognitive:  Appropriate  Insight: Good  Engagement in Group:  Limited  Modes of Intervention:  Discussion  Additional Comments:     Reymundo Poll 03/12/2023, 11:28 AM

## 2023-03-12 NOTE — [facility] Group Notes (Signed)
Adult Psychoeducational Group Note  Date:  03/12/2023 Time:  9:37 PM  Group Topic/Focus:  Wrap-Up Group:   The focus of this group is to help patients review their daily goal of treatment and discuss progress on daily workbooks.  Participation Level:  Did Not Attend  Christ Kick 03/12/2023, 9:37 PM
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
## 2023-03-12 NOTE — [facility] Group Notes (Signed)
Spirituality group facilitated by Chaplain Katy Claussen, BCC.   Group Description: Group focused on topic of hope. Patients participated in facilitated discussion around topic, connecting with one another around experiences and definitions for hope. Group members engaged with visual explorer photos, reflecting on what hope looks like for them today. Group engaged in discussion around how their definitions of hope are present today in hospital.   Modalities: Psycho-social ed, Adlerian, Narrative, MI   Patient Progress: Did not attend.  

## 2023-03-12 NOTE — Progress Notes (Signed)
   03/12/23 0635  Vital Signs  Pulse Rate (!) 104  BP (!) 66/47  BP Location Left Arm  BP Method Automatic  Patient Position (if appropriate) Standing   Patient complained of a little lightheadedness.  Hydrated with a cup of Gatorade.  Patient reported, "feeling okay."    Patient slept 8.5 hours with the increase in Trazodone bedtime dosage.

## 2023-03-12 NOTE — Progress Notes (Signed)
   03/12/23 0530  15 Minute Checks  Location Bedroom  Visual Appearance Calm  Behavior Sleeping  Sleep (Behavioral Health Patients Only)  Calculate sleep? (Click Yes once per 24 hr at 0600 safety check) Yes  Documented sleep last 24 hours 8.5

## 2023-03-12 NOTE — Progress Notes (Signed)
   03/12/23 1000  Psych Admission Type (Psych Patients Only)  Admission Status Voluntary  Psychosocial Assessment  Patient Complaints None  Eye Contact Fair  Facial Expression Animated  Affect Appropriate to circumstance  Speech Logical/coherent  Interaction Assertive  Motor Activity Other (Comment) (wnl)  Appearance/Hygiene Unremarkable  Behavior Characteristics Cooperative;Appropriate to situation  Mood Pleasant;Euthymic  Thought Process  Coherency WDL  Content WDL  Delusions None reported or observed  Perception WDL  Hallucination None reported or observed  Judgment Impaired  Confusion None  Danger to Self  Current suicidal ideation? Denies  Agreement Not to Harm Self Yes  Description of Agreement verbal  Danger to Others  Danger to Others None reported or observed

## 2023-03-12 NOTE — Progress Notes (Signed)
  During this writer's shift, the patient is alert and oriented.  Patient presents with mild anxiety.  Patient is pleasant during assessment.  Patient denies SI/HI/AVH and she denies pain.  Administered PRN Trazodone per [facility] per patient request related to sleep disturbance on the previous nigh t.  Patient is safe on the unit with q 15 minutes safety checks.

## 2023-03-12 NOTE — Plan of Care (Signed)
  Problem: Education: Goal: Mental status will improve Outcome: Progressing   Problem: Activity: Goal: Interest or engagement in activities will improve Outcome: Progressing Goal: Sleeping patterns will improve Outcome: Progressing   Problem: Coping: Goal: Ability to verbalize frustrations and anger appropriately will improve Outcome: Progressing Goal: Ability to demonstrate self-control will improve Outcome: Progressing   Problem: Health Behavior/Discharge Planning: Goal: Compliance with treatment plan for underlying cause of condition will improve Outcome: Progressing   Problem: Safety: Goal: Periods of time without injury will increase Outcome: Progressing   Problem: Education: Goal: Ability to state activities that reduce stress will improve Outcome: Progressing   Problem: Self-Concept: Goal: Ability to identify factors that promote anxiety will improve Outcome: Progressing Goal: Ability to modify response to factors that promote anxiety will improve Outcome: Progressing

## 2023-03-12 NOTE — [facility] Suicide Risk Assessment (Signed)
[facility] INPATIENT:  Family/Significant Other Suicide Prevention Education  Suicide Prevention Education:  Education Completed; Tina Thomas  ( sister) [phone],  (name of family member/significant other) has been identified by the patient as the family member/significant other with whom the patient will be residing, and identified as the person(s) who will aid the patient in the event of a mental health crisis (suicidal ideations/suicide attempt).  With written consent from the patient, the family member/significant other has been provided the following suicide prevention education, prior to the and/or following the discharge of the patient.  CSW spoke with patient sister and completed safety planning. Sister shared that patient has been dealing with bipolar and ADHD for awhile and the current meds that she was own , was not working. Sister shared that patient was having manic episodes, very paranoid, not sleeping or eat and asked her to take her to the hospital. Sister lives down the street from patient and is very supportive . Sister confirmed that patient did not have any access to guns or weapons in home and once it is time for her to be DC she will pick her up.    The suicide prevention education provided includes the following: Suicide risk factors Suicide prevention and interventions National Suicide Hotline telephone number [facility] assessment telephone number [facility] Emergency Assistance 911 [facility] and/or Residential Mobile Crisis Unit telephone number  Request made of family/significant other to: Remove weapons (e.g., guns, rifles, knives), all items previously/currently identified as safety concern.   Remove drugs/medications (over-the-counter, prescriptions, illicit drugs), all items previously/currently identified as a safety concern.  The family member/significant other verbalizes understanding of the suicide prevention education information provided.   The family member/significant other agrees to remove the items of safety concern listed above.  Tina Thomas 03/12/2023, 1:35 PM

## 2023-03-12 NOTE — Group Note (Signed)
Recreation Therapy Group Note   Group Topic:Problem Solving  Group Date: 03/12/2023 Start Time: 0935 End Time: 1005 Facilitators: Zienna Ahlin-McCall, LRT,CTRS Location: [address] Dayroom   Goal Area(s) Addresses:  Patient will effectively work with peer towards shared goal.  Patient will identify skills used to make activity successful.  Patient will share challenges and verbalize solution-driven approaches used. Patient will identify how skills used during activity can be used to reach post d/c goals.   Group Description: [employer]. Patients were provided the following materials: 4 drinking straws, 5 rubber bands, 5 paper clips, 2 index cards, 2 drinking cups, and 2 toilet paper rolls. Using the provided materials patients were asked to build a launching mechanism to launch a ping pong ball across the room, approximately 10 feet. Patients were divided into teams of 3-5. Instructions required all materials be incorporated into the device, functionality of items left to the peer group's discretion.   Affect/Mood: N/A   Participation Level: Did not attend    Clinical Observations/Individualized Feedback:     Plan: Continue to engage patient in RT group sessions 2-3x/week.   Alayzia Pavlock-McCall, LRT,CTRS 03/12/2023 12:07 PM

## 2023-03-12 NOTE — Progress Notes (Addendum)
Patient's sister, Nira Retort, was contacted for collateral at [phone] on 03/12/2023 2:30 PM.  Nira Retort has been speaking with patient daily, today she reports patient has improved. Reports paranoia has improved, and did not picked up on any depressive or manic symptoms.   Encouraged Jasmin to visit patient. She is caring for patient's daughter and working so it has been difficult to visit, she will try to visit this evening.   Dr. Liston Alba, MD PGY-2, Psychiatry Residency

## 2023-03-13 DIAGNOSIS — F312 Bipolar disorder, current episode manic severe with psychotic features: Secondary | ICD-10-CM | POA: Diagnosis not present

## 2023-03-13 MED ORDER — TRAZODONE HCL 150 MG PO TABS
150.0000 mg | ORAL_TABLET | Freq: Every evening | ORAL | Status: DC | PRN
  Administered 2023-03-14: 150 mg via ORAL
  Filled 2023-03-13: qty 1

## 2023-03-13 NOTE — Group Note (Signed)
Date:  03/13/2023 Time:  11:03 AM  Group Topic/Focus:  Goals Group:   The focus of this group is to help patients establish daily goals to achieve during treatment and discuss how the patient can incorporate goal setting into their daily lives to aide in recovery.    Participation Level:  Did Not Attend  Participation Quality:   Affect:      Cognitive:      Insight: None  Engagement in Group:      Modes of Intervention:      Additional Comments:     Reymundo Poll 03/13/2023, 11:03 AM

## 2023-03-13 NOTE — Progress Notes (Addendum)
Patient denies SI, HI, AVH, depression and anxiety. Patient complains of feeling very tired and spent most of the morning in bed. Patient felt her heart beating rapidly and vitals were checked with HR at 132 bpm. Patient was encouraged to drink a lot of fluids and vitals improved. Patient was encouraged to participate in unit activities and to get out of bed in order to be able to sleep this evening. Patient verbalized understanding but stayed in room for majority of shift. Patient went to cafeteria for meals. Patient received all scheduled medications with no adverse effects noted. Patient remains safe on the unit. Q 15 minute safety checks ongoing.   03/13/23 0900  Psych Admission Type (Psych Patients Only)  Admission Status Voluntary  Psychosocial Assessment  Patient Complaints None  Eye Contact Fair  Facial Expression Animated  Affect Appropriate to circumstance  Speech Logical/coherent  Interaction Assertive  Motor Activity Other (Comment) (wnl)  Appearance/Hygiene Unremarkable  Behavior Characteristics Cooperative;Appropriate to situation  Mood Pleasant;Euthymic  Thought Process  Coherency WDL  Content WDL  Delusions None reported or observed  Perception WDL  Hallucination None reported or observed  Judgment Impaired  Confusion None  Danger to Self  Current suicidal ideation? Denies  Agreement Not to Harm Self Yes  Description of Agreement verbal  Danger to Others  Danger to Others None reported or observed
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
## 2023-03-13 NOTE — Progress Notes (Signed)

## 2023-03-13 NOTE — Plan of Care (Signed)

## 2023-03-13 NOTE — [facility] Group Notes (Signed)
[facility] Group Notes:  (Nursing/MHT/Case Management/Adjunct)  Date:  03/13/2023  Time: 2000  Type of Therapy:   Wrap up group  Participation Level:  Active  Participation Quality:  Appropriate, Attentive, Sharing, and Supportive  Affect:  Appropriate  Cognitive:  Alert  Insight:  Improving  Engagement in Group:  Engaged  Modes of Intervention:  Clarification, Education, and Support  Summary of Progress/Problems: Positive thinking and positive change were discussed.   Marcille Buffy 03/13/2023, 8:59 PM

## 2023-03-13 NOTE — [facility] Group Notes (Signed)
[facility] Group Notes:  (Nursing/MHT/Case Management/Adjunct)  Date:  03/13/2023  Time:  4:14 AM  Type of Therapy:   Wrap-up group  Participation Level:  Active  Participation Quality:  Appropriate  Affect:  Appropriate  Cognitive:  Appropriate  Insight:  Appropriate  Engagement in Group:  Engaged  Modes of Intervention:  Education  Summary of Progress/Problems: Goal to have a visitor, pt didn't meet goal. Day 5/10.   Tina Thomas 03/13/2023, 4:14 AM

## 2023-03-13 NOTE — Group Note (Signed)
LCSW Group Therapy Note   Group Date: 03/13/2023 Start Time: 1100 End Time: 1200  Type of Therapy and Topic:  Group Therapy - Who Am I?  Participation Level:  DID NOT ATTEND  Description of Group The focus of this group was to aid patients in self-exploration and awareness. Patients were guided in exploring various factors of oneself to include interests, readiness to change, management of emotions, and individual perception of self. Patients were provided with complementary worksheets exploring hidden talents, ease of asking other for help, music/media preferences, understanding and responding to feelings/emotions, and hope for the future. At group closing, patients were encouraged to adhere to discharge plan to assist in continued self-exploration and understanding.  Therapeutic Goals Patients learned that self-exploration and awareness is an ongoing process Patients identified their individual skills, preferences, and abilities Patients explored their openness to establish and confide in supports Patients explored their readiness for change and progression of mental health  Summary of Patient Progress:  Patient actively engaged in introductory check-in. Patient actively engaged in activity of self-exploration and identification, completing complementary worksheet to assist in discussion. Patient identified various factors ranging from hidden talents, favorite music and movies, trusted individuals, accountability, and individual perceptions of self and hope. Pt engaged in processing thoughts and feelings as well as means of reframing thoughts. Pt proved receptive of alternate group members input and feedback from CSW.   Therapeutic Modalities Cognitive Behavioral Therapy Motivational Interviewing      Therapeutic Modalities Cognitive Behavioral Therapy Motivational Interviewing     [name] 03/13/2023  12:18 PM

## 2023-03-14 ENCOUNTER — Encounter (HOSPITAL_COMMUNITY): Payer: Self-pay

## 2023-03-14 DIAGNOSIS — F312 Bipolar disorder, current episode manic severe with psychotic features: Secondary | ICD-10-CM | POA: Diagnosis not present

## 2023-03-14 MED ORDER — LAMOTRIGINE 25 MG PO TABS
25.0000 mg | ORAL_TABLET | Freq: Two times a day (BID) | ORAL | Status: DC
  Administered 2023-03-14 – 2023-03-15 (×3): 25 mg via ORAL
  Filled 2023-03-14 (×5): qty 1

## 2023-03-14 MED ORDER — LORATADINE 10 MG PO TABS
10.0000 mg | ORAL_TABLET | Freq: Every day | ORAL | Status: DC
  Administered 2023-03-14 – 2023-03-15 (×2): 10 mg via ORAL
  Filled 2023-03-14 (×3): qty 1

## 2023-03-14 NOTE — Plan of Care (Signed)

## 2023-03-14 NOTE — Plan of Care (Signed)
°  Problem: Education: Goal: Emotional status will improve Outcome: Progressing Goal: Mental status will improve Outcome: Progressing   Problem: Activity: Goal: Interest or engagement in activities will improve Outcome: Progressing   Problem: Coping: Goal: Ability to verbalize frustrations and anger appropriately will improve Outcome: Progressing   Problem: Safety: Goal: Periods of time without injury will increase Outcome: Progressing

## 2023-03-14 NOTE — Group Note (Signed)
Recreation Therapy Group Note   Group Topic:Relaxation  Group Date: 03/14/2023 Start Time: 1610 End Time: 1015 Facilitators: Valery Amedee-McCall, LRT,CTRS Location: [address] Dayroom   Goal Area(s) Addresses:  Patient will identify positive stress management techniques. Patient will identify benefits of using stress management post d/c.  Group Description: Meditation.  LRT and patients discussed the importance of taking time to release stress and clear the mind. LRT played a meditation that walked patients through breathing techniques and ways to refocus on positive feelings and mindset.    Affect/Mood: Appropriate   Participation Level: Engaged   Participation Quality: Independent   Behavior: Appropriate   Speech/Thought Process: Focused   Insight: Good   Judgement: Good   Modes of Intervention: Meditation   Patient Response to Interventions:  Engaged   Education Outcome:  Acknowledges education   Clinical Observations/Individualized Feedback: Pt attended and participated in group session.    Plan: Continue to engage patient in RT group sessions 2-3x/week.   Caidyn Henricksen-McCall, LRT,CTRS  03/14/2023 11:30 AM
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
## 2023-03-14 NOTE — [facility] Group Notes (Signed)
[facility] Group Notes:  (Nursing/MHT/Case Management/Adjunct)  Date:  03/14/2023  Time:  9:28 PM  Type of Therapy:   Wrap-up group  Participation Level:  Active  Participation Quality:  Appropriate  Affect:  Appropriate  Cognitive:  Appropriate  Insight:  Appropriate  Engagement in Group:  Engaged  Modes of Intervention:  Education  Summary of Progress/Problems: Pt goal to be d/c. Pt reports she met goal, has d/c date. Rated day 9/10.  Noah Delaine 03/14/2023, 9:28 PM

## 2023-03-14 NOTE — BH IP Treatment Plan (Signed)
Interdisciplinary Treatment and Diagnostic Plan Update  03/14/2023 Time of Session: 9:40 AM (UPDATE)  Kimmerly Mctague MRN: [id number]  Principal Diagnosis: Bipolar affective disorder, currently manic, severe, with psychotic features (HCC)  Secondary Diagnoses: Principal Problem:   Bipolar affective disorder, currently manic, severe, with psychotic features (HCC)   Current Medications:  Current Facility-Administered Medications  Medication Dose Route Frequency Provider Last Rate Last Admin   acetaminophen (TYLENOL) tablet 650 mg  650 mg Oral Q4H PRN Dahlia Byes C, NP   650 mg at 03/09/23 1956   alum & mag hydroxide-simeth (MAALOX/MYLANTA) 200-200-20 MG/5ML suspension 30 mL  30 mL Oral Q4H PRN Dahlia Byes C, NP   30 mL at 03/11/23 4401   ARIPiprazole (ABILIFY) tablet 10 mg  10 mg Oral Daily Carrion-Carrero, Karle Starch, MD   10 mg at 03/14/23 0747   diphenhydrAMINE (BENADRYL) capsule 50 mg  50 mg Oral TID PRN Earney Navy, NP       Or   diphenhydrAMINE (BENADRYL) injection 50 mg  50 mg Intramuscular TID PRN Earney Navy, NP       feeding supplement (ENSURE ENLIVE / ENSURE PLUS) liquid 237 mL  237 mL Oral TID BM Carrion-Carrero, Margely, MD   237 mL at 03/13/23 2140   haloperidol (HALDOL) tablet 5 mg  5 mg Oral TID PRN Dahlia Byes C, NP       Or   haloperidol lactate (HALDOL) injection 5 mg  5 mg Intramuscular TID PRN Earney Navy, NP       lamoTRIgine (LAMICTAL) tablet 25 mg  25 mg Oral Q12H Carrion-Carrero, Margely, MD   25 mg at 03/14/23 0747   LORazepam (ATIVAN) tablet 2 mg  2 mg Oral TID PRN Earney Navy, NP       Or   LORazepam (ATIVAN) injection 2 mg  2 mg Intramuscular TID PRN Dahlia Byes C, NP       magnesium hydroxide (MILK OF MAGNESIA) suspension 30 mL  30 mL Oral Daily PRN Dahlia Byes C, NP       ondansetron (ZOFRAN) tablet 4 mg  4 mg Oral Q8H PRN Dahlia Byes C, NP   4 mg at 03/13/23 0719   polyethylene glycol (MIRALAX /  GLYCOLAX) packet 17 g  17 g Oral Daily PRN Carrion-Carrero, Karle Starch, MD       traZODone (DESYREL) tablet 150 mg  150 mg Oral QHS PRN Carrion-Carrero, Margely, MD       PTA Medications: Medications Prior to Admission  Medication Sig Dispense Refill Last Dose   ARIPiprazole (ABILIFY) 5 MG tablet Take 5 mg by mouth daily.      atomoxetine (STRATTERA) 40 MG capsule Take 40 mg by mouth every morning.      metroNIDAZOLE (FLAGYL) 500 MG tablet Take 1 tablet (500 mg total) by mouth 2 (two) times daily. 14 tablet 0     Patient Stressors: Other: Requesting medication adjustment    Patient Strengths: Communication skills   Treatment Modalities: Medication Management, Group therapy, Case management,  1 to 1 session with clinician, Psychoeducation, Recreational therapy.   Physician Treatment Plan for Primary Diagnosis: Bipolar affective disorder, currently manic, severe, with psychotic features (HCC) Long Term Goal(s): Improvement in symptoms so as ready for discharge   Short Term Goals: Ability to identify changes in lifestyle to reduce recurrence of condition will improve Ability to verbalize feelings will improve  Medication Management: Evaluate patient's response, side effects, and tolerance of medication regimen.  Therapeutic Interventions: 1 to 1 sessions,  Unit Group sessions and Medication administration.  Evaluation of Outcomes: Progressing  Physician Treatment Plan for Secondary Diagnosis: Principal Problem:   Bipolar affective disorder, currently manic, severe, with psychotic features (HCC)  Long Term Goal(s): Improvement in symptoms so as ready for discharge   Short Term Goals: Ability to identify changes in lifestyle to reduce recurrence of condition will improve Ability to verbalize feelings will improve     Medication Management: Evaluate patient's response, side effects, and tolerance of medication regimen.  Therapeutic Interventions: 1 to 1 sessions, Unit Group sessions  and Medication administration.  Evaluation of Outcomes: Progressing   RN Treatment Plan for Primary Diagnosis: Bipolar affective disorder, currently manic, severe, with psychotic features (HCC) Long Term Goal(s): Knowledge of disease and therapeutic regimen to maintain health will improve  Short Term Goals: Ability to remain free from injury will improve, Ability to verbalize frustration and anger appropriately will improve, Ability to participate in decision making will improve, Ability to verbalize feelings will improve, Ability to identify and develop effective coping behaviors will improve, and Compliance with prescribed medications will improve  Medication Management: RN will administer medications as ordered by provider, will assess and evaluate patient's response and provide education to patient for prescribed medication. RN will report any adverse and/or side effects to prescribing provider.  Therapeutic Interventions: 1 on 1 counseling sessions, Psychoeducation, Medication administration, Evaluate responses to treatment, Monitor vital signs and CBGs as ordered, Perform/monitor CIWA, COWS, AIMS and Fall Risk screenings as ordered, Perform wound care treatments as ordered.  Evaluation of Outcomes: Progressing   LCSW Treatment Plan for Primary Diagnosis: Bipolar affective disorder, currently manic, severe, with psychotic features (HCC) Long Term Goal(s): Safe transition to appropriate next level of care at discharge, Engage patient in therapeutic group addressing interpersonal concerns.  Short Term Goals: Engage patient in aftercare planning with referrals and resources, Increase social support, Increase emotional regulation, Facilitate acceptance of mental health diagnosis and concerns, Identify triggers associated with mental health/substance abuse issues, and Increase skills for wellness and recovery  Therapeutic Interventions: Assess for all discharge needs, 1 to 1 time with Social  worker, Explore available resources and support systems, Assess for adequacy in community support network, Educate family and significant other(s) on suicide prevention, Complete Psychosocial Assessment, Interpersonal group therapy.  Evaluation of Outcomes: Progressing   Progress in Treatment: Attending groups: Yes. Participating in groups: Yes. Taking medication as prescribed: Yes. Toleration medication: Yes. Family/Significant other contact made: Yes; Sister Leslie Andrea [phone] Patient understands diagnosis: Yes. Discussing patient identified problems/goals with staff: Yes. Medical problems stabilized or resolved: Yes. Denies suicidal/homicidal ideation: Yes. Issues/concerns per patient self-inventory: No   New problem(s) identified: No, Describe:  none reported   New Short Term/Long Term Goal(s):medication management for mood stabilization   Patient Goals:  Medication management, work on mood and coping skills,development of comprehensive mental wellness/sobriety plan   Discharge Plan or Barriers: Patient recently admitted. CSW will continue to follow and assess for appropriate referrals and possible discharge planning.   Reason for Continuation of Hospitalization: Anxiety Mania Medication stabilization   Estimated Length of Stay:1-2 days  Last 3 [place] Suicide Severity Risk Score: Flowsheet Row Ad[facility] (Current) [facility] from [facility]08/2023 in [facility] ED from 05/10/2022 in [facility] at [facility] RISK CATEGORY No Risk No Risk No Risk       Last [facility] 2/9 Scores:    04/03/2022  8:51 AM 04/11/2020    2:44 PM 09/11/2017    2:52 PM  Depression screen PHQ 2/9  Decreased Interest 0 0 0  Down, Depressed, Hopeless 0 0 0  PHQ - 2 Score 0 0 0  Altered sleeping 0 0 0  Tired, decreased energy 0 0 0  Change in appetite 0 0 1  Feeling bad or  failure about yourself  0 0 0  Trouble concentrating 0 0 0  Moving slowly or fidgety/restless 0 0 0  Suicidal thoughts 0 0 0  PHQ-9 Score 0 0 1    Scribe for Treatment Team: Beather Arbour 03/14/2023 1:18 PM

## 2023-03-14 NOTE — Progress Notes (Signed)
   03/13/23 2140  Psych Admission Type (Psych Patients Only)  Admission Status Voluntary  Psychosocial Assessment  Patient Complaints None  Eye Contact Fair  Facial Expression Animated  Affect Appropriate to circumstance  Speech Logical/coherent  Interaction Assertive  Motor Activity Other (Comment) (WNL)  Appearance/Hygiene Unremarkable  Behavior Characteristics Cooperative;Appropriate to situation  Mood Pleasant;Euthymic  Thought Process  Coherency WDL  Content WDL  Delusions None reported or observed  Perception WDL  Hallucination None reported or observed  Judgment Impaired  Confusion None  Danger to Self  Current suicidal ideation? Denies  Agreement Not to Harm Self Yes  Description of Agreement verbal  Danger to Others  Danger to Others None reported or observed   Pt is pleasant and cooperative. Pt was offered support and encouragement. Pt was given scheduled medications. Q 15 minute checks were done for safety. Pt attend group and interacts well with peers and staff. Pt has no complaints.Pt receptive to treatment and safety maintained on unit.

## 2023-03-14 NOTE — Progress Notes (Signed)
   03/14/23 0800  Psych Admission Type (Psych Patients Only)  Admission Status Voluntary  Psychosocial Assessment  Patient Complaints None  Eye Contact Fair  Facial Expression Animated  Affect Appropriate to circumstance  Speech Logical/coherent  Interaction Assertive  Motor Activity Other (Comment) (WDL)  Appearance/Hygiene Unremarkable  Behavior Characteristics Cooperative;Appropriate to situation  Mood Pleasant  Thought Process  Coherency WDL  Content WDL  Delusions None reported or observed  Perception WDL  Hallucination None reported or observed  Judgment Impaired  Confusion None  Danger to Self  Current suicidal ideation? Denies  Agreement Not to Harm Self Yes  Description of Agreement Verbal  Danger to Others  Danger to Others None reported or observed

## 2023-03-14 NOTE — Progress Notes (Signed)
   03/14/23 0555  15 Minute Checks  Location Bedroom  Visual Appearance Calm  Behavior Sleeping  Sleep (Behavioral Health Patients Only)  Calculate sleep? (Click Yes once per 24 hr at 0600 safety check) Yes  Documented sleep last 24 hours 7.5

## 2023-03-15 DIAGNOSIS — F312 Bipolar disorder, current episode manic severe with psychotic features: Secondary | ICD-10-CM

## 2023-03-15 MED ORDER — LAMOTRIGINE 25 MG PO TABS: 25.0000 mg | ORAL_TABLET | Freq: Two times a day (BID) | ORAL | 0 refills | Status: AC

## 2023-03-15 MED ORDER — TRAZODONE HCL 150 MG PO TABS: 150.0000 mg | ORAL_TABLET | Freq: Every evening | ORAL | 0 refills | Status: AC | PRN

## 2023-03-15 MED ORDER — ARIPIPRAZOLE 10 MG PO TABS: 10.0000 mg | ORAL_TABLET | Freq: Every day | ORAL | 0 refills | Status: AC

## 2023-03-15 NOTE — [facility]HH Suicide Risk Assessment (Signed)
[facility] Discharge Suicide Risk Assessment   Principal Problem: Bipolar affective disorder, currently manic, severe, with psychotic features [facility]) Discharge Diagnoses: Principal Problem:   Bipolar affective disorder, currently manic, severe, with psychotic features (HCC)   Total Time spent with patient: 45 minutes  Reason for admission: Tina Thomas is a 27 yo female with a PPHx significant for bipolar I disorder and ADHD presenting voluntarily to [facility] on 03/09/2023 for acute mania in the setting of multiple psychosocial stressors and medication non-adherence.  Patient has been admitted for medication management and stabilization of her current psychiatric symptoms.   PTA Medications:  Medications Prior to Admission  Medication Sig Dispense Refill Last Dose   ARIPiprazole (ABILIFY) 5 MG tablet Take 5 mg by mouth daily.         atomoxetine (STRATTERA) 40 MG capsule Take 40 mg by mouth every morning.         metroNIDAZOLE (FLAGYL) 500 MG tablet Take 1 tablet (500 mg total) by mouth 2 (two) times daily. 14 tablet 0      Hospital Course:  Upon assessment at time of admission, she reports a 2-week history of worsening irritability, impulsivity in the form of financial indiscretions, racing thoughts, and pressured speech that led to her current hospitalization.  This has also occurred in the context of a 71-month history of insomnia, patient reporting sleeping on average 3-4 hours nightly.  These worsening symptoms have also occurred in the setting of patient discontinuing her outpatient psychiatric medications which include Abilify 5 mg and Strattera 40 mg.  Patient believes these medications were worsening her irritability and chose to stop taking them.  She also describes paranoia related to a coworker that she began to suspect was performing witchcraft against her.  Patient has a previous diagnosis of bipolar 1 disorder, with frequent manic episodes.  She is unable to detail the exact frequency of these  episodes, but she currently reports that her manic periods are defined by periods of expansive energy and mood lasting up to 3-4 days.  During this most recent period of mania, patient describes having multiple hyperreligious delusions, with an overwhelming fear that God was going to send her to hell for her sins.  Patient admits to showering up to 4 times a day, attempting to cleanse herself.  She also believes that an angel would appear at any moment to speak with her.  Patient found herself crying throughout the day, begging God to keep her from losing her mind.   During the patient's hospitalization, patient had extensive initial psychiatric evaluation, and follow-up psychiatric evaluations every day.  Psychiatric diagnoses provided upon initial assessment: Bipolar 1 disorder, current episode mixed PTSD Alcohol use disorder, mild Tobacco use disorder in sustained remission  Patient's psychiatric medications were adjusted on admission: Restart home Abilify 5 mg daily for treatment of acute mania and mood stabilization Low threshold to discontinue if patient begins to experience worsening irritability We will also consider addition of Lamictal for maintenance of bipolar disorder. Discontinue Strattera Discontinue Zyprexa Start trazodone 100 mg nightly as needed for insomnia  During the hospitalization, other adjustments were made to the patient's psychiatric medication regimen: Abilify was titrated from 5 to 10 mg for further mood stabilization and to address depressed mood, Lamictal was started and titrated to 25 mg twice daily for mood and depression  Patient's care was discussed during the interdisciplinary team meeting every day during the hospitalization.  The patient denies having side effects to prescribed psychiatric medication.  Gradually, patient started  adjusting to milieu. The patient was evaluated each day by a clinical provider to ascertain response to treatment. Improvement was  noted by the patient's report of decreasing symptoms, improved sleep and appetite, affect, medication tolerance, behavior, and participation in unit programming.  Patient was asked each day to complete a self inventory noting mood, mental status, pain, new symptoms, anxiety and concerns.    Symptoms were reported as significantly decreased or resolved completely by discharge.   On day of discharge, patient was evaluated on 8/24 the patient reports that their mood is stable. The patient denied having suicidal thoughts for more than 48 hours prior to discharge.  Patient denies having homicidal thoughts.  Patient denies having auditory hallucinations.  Patient denies any visual hallucinations or other symptoms of psychosis. The patient was motivated to continue taking medication with a goal of continued improvement in mental health.  On day of discharge patient presented future and goal oriented excited about her daughter joining kindergarten on Monday, identify support from family and sister, able to discuss coping skills with stressors as well as crisis plan if worsening depression or recurring SI after discharge. The patient reports their target psychiatric symptoms of mood instability and manic symptoms responded well to the psychiatric medications, and the patient reports overall benefit other psychiatric hospitalization. Supportive psychotherapy was provided to the patient. The patient also participated in regular group therapy while hospitalized. Coping skills, problem solving as well as relaxation therapies were also part of the unit programming.  Labs were reviewed with the patient, and abnormal results were discussed with the patient.  The patient is able to verbalize their individual safety plan to this provider.  Behavioral Events: None  Restraints: None  Groups: Attended and participated  Medications Changes: As above  Sleep  Sleep:Sleep: Fair   Musculoskeletal: Strength & Muscle  Tone: within normal limits Gait & Station: normal Patient leans: N/A  Psychiatric Specialty Exam  General Appearance: appears at stated age, fairly dressed and groomed  Behavior: pleasant and cooperative  Psychomotor Activity:No psychomotor agitation or retardation noted   Eye Contact: good Speech: normal amount, tone, volume and latency   Mood: euthymic Affect: congruent, pleasant and interactive  Thought Process: linear, goal directed, no circumstantial or tangential thought process noted, no racing thoughts or flight of ideas Descriptions of Associations: intact Thought Content: Hallucinations: denies AH, VH , does not appear responding to stimuli Delusions: No paranoia or other delusions noted Suicidal Thoughts: denies SI, intention, plan  Homicidal Thoughts: denies HI, intention, plan   Alertness/Orientation: alert and fully oriented  Insight: fair, improved Judgment: fair, improved  Memory: intact  Executive Functions  Concentration: intact  Attention Span: Fair Recall: intact Fund of Knowledge: fair   Art therapist  Concentration: intact Attention Span: Fair Recall: intact Fund of Knowledge: fair   Assets  Assets: Manufacturing systems engineer; Desire for Improvement; Resilience   Physical Exam: Physical Exam ROS Blood pressure 102/68, pulse (!) 127, temperature 98.2 F (36.8 C), temperature source Oral, resp. rate 17, height 5\' 9"  (1.753 m), weight 61.8 kg, SpO2 99%, unknown if currently breastfeeding. Body mass index is 20.11 kg/m.  Mental Status Per Nursing Assessment::   On Admission:  NA (Medication adjustment)  Demographic Factors:  Adolescent or young adult  Loss Factors: NA  Historical Factors: NA  Risk Reduction Factors:   Responsible for children under 51 years of age, Sense of responsibility to family, Living with another person, especially a relative, and Positive social support  Continued Clinical Symptoms: Symptoms  of bipolar  disorder improved significantly with medication management during hospital stay Bipolar Disorder:   Mixed State  Cognitive Features That Contribute To Risk:  None    Suicide Risk:  Minimal: No identifiable suicidal ideation.  Patients presenting with no risk factors but with morbid ruminations; may be classified as minimal risk based on the severity of the depressive symptoms   Follow-up Information     [facility], [place]. Go on 03/17/2023.   Why: You have an appointment for medication management services on 03/17/23 at 2:00 pm.   This appointment will be held in person.   You also have an appointment for therapy services on 03/18/23 at 4:00 pm. Contact information: [address] [place] [address] [phone]         [facility]. Call.   Why: Please call this provider to get connected with therapy services if you cannot afford your prior therapist co-pays,. This provider does accept your insurance but wants you to complete intake packet for scheduling an appointment. Contact information: [address] [place], [place] [address] [phone]         [facility]. Call.   Specialty: Urgent Care Why: You may go to this provider for an assessment to maintain therapy and medication management services. For fastest service please go on Monday-Friday arrive by 7 am to be seen same day. Contact information: [address] [place] [address] [phone]                Plan Of Care/Follow-up recommendations:    Discharge recommendations:     Activity: as tolerated  Diet: heart healthy  # It is recommended to the patient to continue psychiatric medications as prescribed, after discharge from the hospital.     # It is recommended to the patient to follow up with your outpatient psychiatric provider and PCP.   # It was discussed with the patient, the impact of  alcohol, drugs, tobacco have been there overall psychiatric and medical wellbeing, and total abstinence from substance use was recommended the patient.ed.   # Prescriptions provided or sent directly to preferred pharmacy at discharge. Patient agreeable to plan. Given opportunity to ask questions. Appears to feel comfortable with discharge.    # In the event of worsening symptoms, the patient is instructed to call the crisis hotline, 911 and or go to the nearest ED for appropriate evaluation and treatment of symptoms. To follow-up with primary care provider for other medical issues, concerns and or health care needs   # Patient was discharged home with a plan to follow up as noted above.   Patient agrees with D/C instructions and plan.  The patient received suicide prevention pamphlet:  Yes Belongings returned:  Clothing and Valuables  Total Time Spent in Direct Patient Care:  I personally spent 45 minutes on the unit in direct patient care. The direct patient care time included face-to-face time with the patient, reviewing the patient's chart, communicating with other professionals, and coordinating care. Greater than 50% of this time was spent in counseling or coordinating care with the patient regarding goals of hospitalization, psycho-education, and discharge planning needs.   Alyha Marines 03/15/2023, 8:05 AM

## 2023-03-15 NOTE — Progress Notes (Addendum)
  [facility] Adult Case Management Discharge Plan :  Will you be returning to the same living situation after discharge:  Yes,  Patient will return home. At discharge, do you have transportation home?: Yes,  Patient's sister will picking her up. Do you have the ability to pay for your medications: Yes,  Patient has insurance  Release of information consent forms completed and in the chart;  Patient's signature needed at discharge.  Patient to Follow up at:  Follow-up Information     [facility], [place]. Go on 03/17/2023.   Why: You have an appointment for medication management services on 03/17/23 at 2:00 pm.   This appointment will be held in person.   You also have an appointment for therapy services on 03/18/23 at 4:00 pm. Contact information: [address] [place] [address] [phone]         [facility]. Call.   Why: Please call this provider to get connected with therapy services if you cannot afford your prior therapist co-pays,. This provider does accept your insurance but wants you to complete intake packet for scheduling an appointment. Contact information: [address] [place], [place] [address] [phone]         [facility]. Call.   Specialty: Urgent Care Why: You may go to this provider for an assessment to maintain therapy and medication management services. For fastest service please go on Monday-Friday arrive by 7 am to be seen same day. Contact information: [address] [place] [address] [phone]                Next level of care provider has access to [facility] Link:no  Safety Planning and Suicide Prevention discussed: Yes,  Completed with sister on 03/12/23     Has patient been referred to the Quitline?: Patient refused referral for treatment  Patient has been referred for addiction treatment: No known substance use  disorder.  Zilpha Mcandrew O Lauri Purdum, LCSWA 03/15/2023, 8:59 AM

## 2023-03-15 NOTE — Progress Notes (Signed)
   03/14/23 2122  Psych Admission Type (Psych Patients Only)  Admission Status Voluntary  Psychosocial Assessment  Patient Complaints None  Eye Contact Fair  Facial Expression Animated  Affect Appropriate to circumstance  Speech Logical/coherent  Interaction Assertive  Motor Activity Other (Comment) (WDL)  Appearance/Hygiene Unremarkable  Behavior Characteristics Cooperative;Appropriate to situation  Mood Pleasant  Thought Process  Coherency WDL  Content WDL  Delusions None reported or observed  Perception WDL  Hallucination None reported or observed  Judgment Impaired  Confusion None  Danger to Self  Current suicidal ideation? Denies  Agreement Not to Harm Self Yes  Description of Agreement verbal  Danger to Others  Danger to Others None reported or observed

## 2023-03-15 NOTE — Progress Notes (Signed)
D. Pt presented with a bright affect, was friendly upon approach. Pt reported that she was looking forward to being discharged today, and was excited to go shopping for back to school supplies with her 29 year old daughter who will be starting kindergarten.   Pt currently denies SI/HI and AVH  A. Labs and vitals monitored. Pt given and educated on medications. Pt supported emotionally and encouraged to express concerns and ask questions.   R. Pt remains safe with 15 minute checks. Will continue POC.

## 2023-03-15 NOTE — [facility] Group Notes (Signed)
[facility] Group Notes:  (Nursing/MHT/Case Management/Adjunct)  Date:  03/15/2023  Time:  8:54 AM  Type of Therapy:   Goals group  Participation Level:  Active  Participation Quality:  Appropriate  Affect:  Appropriate  Cognitive:  Appropriate  Insight:  Appropriate  Engagement in Group:  Engaged  Modes of Intervention:  Orientation  Summary of Progress/Problems: Goal is discharge today  Azalee Course 03/15/2023, 8:54 AM

## 2023-03-15 NOTE — Progress Notes (Signed)
Pt discharged to lobby- sister present for transportation. Pt was stable and appreciative at that time. All papers and electronic prescriptions were given and valuables returned. Suicide safety plan completed- copy given to patient. Verbal understanding expressed. Denies SI/HI and A/VH. Pt given opportunity to express concerns and ask questions.

## 2023-03-15 NOTE — Discharge Summary (Signed)
Physician Discharge Summary Note  Patient:  Tina Thomas is an 27 y.o., female MRN:  [id number] DOB:  11-13-1995 Patient phone:  [phone] (home)  Patient address:   [address] [place] [address],  Total Time spent with patient: 45 minutes  Date of Admission:  03/09/2023 Date of Discharge: 03/15/2023  Reason for Admission:  Tina Thomas is a 27 yo female with a PPHx significant for bipolar I disorder and ADHD presenting voluntarily to [facility] on 03/09/2023 for acute mania in the setting of multiple psychosocial stressors and medication non-adherence. Patient has been admitted for medication management and stabilization of her current psychiatric symptoms.   Principal Problem: Bipolar affective disorder, currently manic, severe, with psychotic features [facility]) Discharge Diagnoses: Principal Problem:   Bipolar affective disorder, currently manic, severe, with psychotic features [facility])   Past Psychiatric History:  Current Psychiatrist: Kallie Locks, FNP at [facility] Current Therapist: No Previous Psych Diagnoses: ADHD, Bipolar I Disorder Current psychiatric medications: Strattera 40 mg daily for ADHD, Abilify 5 mg for mood stabilization Psychiatric medication history: Denies Prior inpatient treatment: Reports 1 prior psychiatric admission at [facility] at age 36, she was diagnosed with bipolar disorder at that time. Prior rehab hx: Denies Psychotherapy hx: No History of suicide: as described in the HPI History of homicide or aggression: as described in the HPI Neuromodulation history: None identified on chart review  Past Medical History:  Past Medical History:  Diagnosis Date   Anxiety    Bipolar 1 disorder, depressed (HCC)    Sickle cell trait (HCC)    UTI (urinary tract infection)     Past Surgical History:  Procedure Laterality Date   DILATION AND CURETTAGE OF UTERUS     FOOT SURGERY Bilateral     Family History:  Family History  Problem Relation Age of  Onset   Hypertension Mother    Diabetes Sister    Cancer Maternal Grandmother    Family Psychiatric  History:  Psych: Reports she has 3 sisters with bipolar disorder Substance use family hx: Mother-alcohol, other drugs (not specified).  Reports father also uses drugs, unspecified Social History:  Social History   Substance and Sexual Activity  Alcohol Use Not Currently   Comment: 1 bottle of wine every two days      Social History   Substance and Sexual Activity  Drug Use No    Social History   Socioeconomic History   Marital status: Single    Spouse name: Not on file   Number of children: Not on file   Years of education: Not on file   Highest education level: Not on file  Occupational History   Not on file  Tobacco Use   Smoking status: Former    Types: Cigarettes   Smokeless tobacco: Never  Substance and Sexual Activity   Alcohol use: Not Currently    Comment: 1 bottle of wine every two days    Drug use: No   Sexual activity: Yes    Birth control/protection: None  Other Topics Concern   Not on file  Social History Narrative   Not on file   Social Determinants of Health   Financial Resource Strain: Not on file  Food Insecurity: No Food Insecurity (03/09/2023)   Hunger Vital Sign    Worried About Running Out of Food in the Last Year: Never true    Ran Out of Food in the Last Year: Never true  Transportation Needs: No Transportation Needs (03/09/2023)  PRAPARE - Administrator, Civil Service (Medical): No    Lack of Transportation (Non-Medical): No  Physical Activity: Not on file  Stress: Not on file  Social Connections: Not on file   Living situation: Patient is originally from [place], moved to [place] when she was a child.  She currently lives in [place] in an apartment with her daughter. Education: Printmaker in college, currently working on early childhood education bachelors Work:  Works as a Manufacturing systems engineer during the  weekdays, also babysits on weekends Marital Status: Single Children: 1 daughter, Tina Thomas, 21 yo Access to Firearms: Patient reports no guns in the home Abuse: None except as detailed in HPI Legal:Denies any Military: None identified on chart review  Substance Use History:  Alcohol: Recently drank 1 ounce can of angry orchard cider prior to arriving at [facility]. describes an unhealthy relationship with alcohol.  Reports she is not a daily drinker.  Admits to having "blackouts" once in a while.  She began drinking heavily at age 52, briefly alludes to a period of heavy drinking for 2 years due to "heartbreak and poor living conditions".  Tobacco: Reports smoking cigarettes between the ages of [age], smoking approximately 1 ppdd q3 days.  Patient quit at age 44. Illicit drugs: Denies Rx drug abuse: Denies Rehab hx: Denies  Hospital Course:   Upon assessment at time of admission, she reports a 2-week history of worsening irritability, impulsivity in the form of financial indiscretions, racing thoughts, and pressured speech that led to her current hospitalization.  This has also occurred in the context of a 45-month history of insomnia, patient reporting sleeping on average 3-4 hours nightly.  These worsening symptoms have also occurred in the setting of patient discontinuing her outpatient psychiatric medications which include Abilify 5 mg and Strattera 40 mg.  Patient believes these medications were worsening her irritability and chose to stop taking them.  She also describes paranoia related to a coworker that she began to suspect was performing witchcraft against her.  Patient has a previous diagnosis of bipolar 1 disorder, with frequent manic episodes.  She is unable to detail the exact frequency of these episodes, but she currently reports that her manic periods are defined by periods of expansive energy and mood lasting up to 3-4 days.  During this most recent period of mania, patient describes having  multiple hyperreligious delusions, with an overwhelming fear that God was going to send her to hell for her sins.  Patient admits to showering up to 4 times a day, attempting to cleanse herself.  She also believes that an angel would appear at any moment to speak with her.  Patient found herself crying throughout the day, begging God to keep her from losing her mind.   During the patient's hospitalization, patient had extensive initial psychiatric evaluation, and follow-up psychiatric evaluations every day.   Psychiatric diagnoses provided upon initial assessment: Bipolar 1 disorder, current episode mixed PTSD Alcohol use disorder, mild Tobacco use disorder in sustained remission   Patient's psychiatric medications were adjusted on admission: Restart home Abilify 5 mg daily for treatment of acute mania and mood stabilization Low threshold to discontinue if patient begins to experience worsening irritability We will also consider addition of Lamictal for maintenance of bipolar disorder. Discontinue Strattera Discontinue Zyprexa Start trazodone 100 mg nightly as needed for insomnia   During the hospitalization, other adjustments were made to the patient's psychiatric medication regimen: Abilify was titrated from 5 to 10 mg for  further mood stabilization and to address depressed mood, Lamictal was started and titrated to 25 mg twice daily for mood and depression   Patient's care was discussed during the interdisciplinary team meeting every day during the hospitalization.   The patient denies having side effects to prescribed psychiatric medication.   Gradually, patient started adjusting to milieu. The patient was evaluated each day by a clinical provider to ascertain response to treatment. Improvement was noted by the patient's report of decreasing symptoms, improved sleep and appetite, affect, medication tolerance, behavior, and participation in unit programming.  Patient was asked each day to  complete a self inventory noting mood, mental status, pain, new symptoms, anxiety and concerns.     Symptoms were reported as significantly decreased or resolved completely by discharge.    On day of discharge, patient was evaluated on 8/24 the patient reports that their mood is stable. The patient denied having suicidal thoughts for more than 48 hours prior to discharge.  Patient denies having homicidal thoughts.  Patient denies having auditory hallucinations.  Patient denies any visual hallucinations or other symptoms of psychosis. The patient was motivated to continue taking medication with a goal of continued improvement in mental health.  On day of discharge patient presented future and goal oriented excited about her daughter joining kindergarten on Monday, identify support from family and sister, able to discuss coping skills with stressors as well as crisis plan if worsening depression or recurring SI after discharge. The patient reports their target psychiatric symptoms of mood instability and manic symptoms responded well to the psychiatric medications, and the patient reports overall benefit other psychiatric hospitalization. Supportive psychotherapy was provided to the patient. The patient also participated in regular group therapy while hospitalized. Coping skills, problem solving as well as relaxation therapies were also part of the unit programming.   Labs were reviewed with the patient, and abnormal results were discussed with the patient.   The patient is able to verbalize their individual safety plan to this provider.   Behavioral Events: None   Restraints: None   Groups: Attended and participated   Medications Changes: As above   Sleep  Sleep:Sleep: Fair      Physical Findings: AIMS: Facial and Oral Movements Muscles of Facial Expression: None, normal Lips and Perioral Area: None, normal Jaw: None, normal Tongue: None, normal,Extremity Movements Upper (arms, wrists,  hands, fingers): None, normal Lower (legs, knees, ankles, toes): None, normal, Trunk Movements Neck, shoulders, hips: None, normal, Overall Severity Severity of abnormal movements (highest score from questions above): None, normal Incapacitation due to abnormal movements: None, normal Patient's awareness of abnormal movements (rate only patient's report): No Awareness, Dental Status Current problems with teeth and/or dentures?: No Does patient usually wear dentures?: No  CIWA:    COWS:     Musculoskeletal: Strength & Muscle Tone: within normal limits Gait & Station: normal Patient leans: N/A   Psychiatric Specialty Exam:  General Appearance: appears at stated age, fairly dressed and groomed  Behavior: pleasant and cooperative  Psychomotor Activity:No psychomotor agitation or retardation noted   Eye Contact: good Speech: normal amount, tone, volume and latency   Mood: euthymic Affect: congruent, pleasant and interactive  Thought Process: linear, goal directed, no circumstantial or tangential thought process noted, no racing thoughts or flight of ideas Descriptions of Associations: intact Thought Content: Hallucinations: denies AH, VH , does not appear responding to stimuli Delusions: No paranoia or other delusions noted Suicidal Thoughts: denies SI, intention, plan  Homicidal Thoughts: denies HI,  intention, plan   Alertness/Orientation: alert and fully oriented  Insight: fair, improved Judgment: fair, improved  Memory: intact  Executive Functions  Concentration: intact  Attention Span: Fair Recall: intact Fund of Knowledge: fair   Assets  Assets: Manufacturing systems engineer; Desire for Improvement; Resilience   Sleep Sleep: Sleep: Fair    Physical Exam:  Physical Exam Vitals and nursing note reviewed.  Constitutional:      Appearance: Normal appearance.  HENT:     Head: Normocephalic and atraumatic.     Nose: Nose normal.  Eyes:     Pupils: Pupils are  equal, round, and reactive to light.  Pulmonary:     Effort: Pulmonary effort is normal.  Musculoskeletal:        General: Normal range of motion.  Neurological:     General: No focal deficit present.     Mental Status: She is alert and oriented to person, place, and time. Mental status is at baseline.  Psychiatric:        Mood and Affect: Mood normal.        Behavior: Behavior normal.        Thought Content: Thought content normal.        Judgment: Judgment normal.    Review of Systems  All other systems reviewed and are negative.  Blood pressure 102/68, pulse (!) 127, temperature 98.2 F (36.8 C), temperature source Oral, resp. rate 17, height 5\' 9"  (1.753 m), weight 61.8 kg, SpO2 99%, unknown if currently breastfeeding. Body mass index is 20.11 kg/m.   Social History   Tobacco Use  Smoking Status Former   Types: Cigarettes  Smokeless Tobacco Never   Tobacco Cessation:  N/A, patient does not currently use tobacco products   Blood Alcohol level:  Lab Results  Component Value Date   ETH 19 (H) 03/08/2023    Metabolic Disorder Labs:  Lab Results  Component Value Date   HGBA1C 5.6 03/11/2023   MPG 114.02 03/11/2023   No results found for: "PROLACTIN" Lab Results  Component Value Date   CHOL 124 03/11/2023   TRIG 55 03/11/2023   HDL 68 03/11/2023   CHOLHDL 1.8 03/11/2023   VLDL 11 03/11/2023   LDLCALC 45 03/11/2023    See Psychiatric Specialty Exam and Suicide Risk Assessment completed by Attending Physician prior to discharge.  Discharge destination:  Home  Is patient on multiple antipsychotic therapies at discharge:  No   Has Patient had three or more failed trials of antipsychotic monotherapy by history:  No  Recommended Plan for Multiple Antipsychotic Therapies: NA  Discharge Instructions     Diet - low sodium heart healthy   Complete by: As directed    Increase activity slowly   Complete by: As directed       Allergies as of 03/15/2023        Reactions   Compazine [prochlorperazine] Anxiety        Medication List     STOP taking these medications    atomoxetine 40 MG capsule Commonly known as: STRATTERA   metroNIDAZOLE 500 MG tablet Commonly known as: FLAGYL       TAKE these medications      Indication  ARIPiprazole 10 MG tablet Commonly known as: ABILIFY Take 1 tablet (10 mg total) by mouth daily. What changed:  medication strength how much to take  Indication: MIXED BIPOLAR AFFECTIVE DISORDER   lamoTRIgine 25 MG tablet Commonly known as: LAMICTAL Take 1 tablet (25 mg total) by mouth every 12 (twelve)  hours.  Indication: Manic-Depression   traZODone 150 MG tablet Commonly known as: DESYREL Take 1 tablet (150 mg total) by mouth at bedtime as needed for sleep.  Indication: Trouble Sleeping        Follow-up Information     [facility], [place]. Go on 03/17/2023.   Why: You have an appointment for medication management services on 03/17/23 at 2:00 pm.   This appointment will be held in person.   You also have an appointment for therapy services on 03/18/23 at 4:00 pm. Contact information: [address] [place] [address] [phone]         [facility]. Call.   Why: Please call this provider to get connected with therapy services if you cannot afford your prior therapist co-pays,. This provider does accept your insurance but wants you to complete intake packet for scheduling an appointment. Contact information: [address] [place], [place] [address] [phone]         [facility]. Call.   Specialty: Urgent Care Why: You may go to this provider for an assessment to maintain therapy and medication management services. For fastest service please go on Monday-Friday arrive by 7 am to be seen same day. Contact information: [address] [place] [address] [phone]                 Discharge recommendations:    Activity: as tolerated  Diet: heart healthy  # It is recommended to the patient to continue psychiatric medications as prescribed, after discharge from the hospital.     # It is recommended to the patient to follow up with your outpatient psychiatric provider and PCP.   # It was discussed with the patient, the impact of alcohol, drugs, tobacco have been there overall psychiatric and medical wellbeing, and total abstinence from substance use was recommended the patient.ed.   # Prescriptions provided or sent directly to preferred pharmacy at discharge. Patient agreeable to plan. Given opportunity to ask questions. Appears to feel comfortable with discharge.    # In the event of worsening symptoms, the patient is instructed to call the crisis hotline, 911 and or go to the nearest ED for appropriate evaluation and treatment of symptoms. To follow-up with primary care provider for other medical issues, concerns and or health care needs   # Patient was discharged home with a plan to follow up as noted above.   Patient agrees with D/C instructions and plan.   The patient received suicide prevention pamphlet:  Yes Belongings returned:  Clothing and Valuables  Total Time Spent in Direct Patient Care:  I personally spent 45 minutes on the unit in direct patient care. The direct patient care time included face-to-face time with the patient, reviewing the patient's chart, communicating with other professionals, and coordinating care. Greater than 50% of this time was spent in counseling or coordinating care with the patient regarding goals of hospitalization, psycho-education, and discharge planning needs.    SignedSarita Bottom, MD 03/15/2023, 8:07 AM

## 2023-03-15 NOTE — Plan of Care (Signed)
  Problem: Education: Goal: Emotional status will improve Outcome: Progressing Goal: Mental status will improve Outcome: Progressing   Problem: Activity: Goal: Interest or engagement in activities will improve Outcome: Progressing   Problem: Coping: Goal: Ability to demonstrate self-control will improve Outcome: Progressing   

## 2023-03-15 NOTE — Progress Notes (Signed)
   03/15/23 0531  15 Minute Checks  Location Bedroom  Visual Appearance Calm  Behavior Composed  Sleep (Behavioral Health Patients Only)  Calculate sleep? (Click Yes once per 24 hr at 0600 safety check) Yes  Documented sleep last 24 hours 7.75

## 2023-09-10 DIAGNOSIS — N898 Other specified noninflammatory disorders of vagina: Secondary | ICD-10-CM | POA: Diagnosis not present

## 2023-09-10 DIAGNOSIS — N939 Abnormal uterine and vaginal bleeding, unspecified: Secondary | ICD-10-CM | POA: Diagnosis not present

## 2023-09-10 DIAGNOSIS — Z113 Encounter for screening for infections with a predominantly sexual mode of transmission: Secondary | ICD-10-CM | POA: Diagnosis not present

## 2023-09-10 DIAGNOSIS — Z01411 Encounter for gynecological examination (general) (routine) with abnormal findings: Secondary | ICD-10-CM | POA: Diagnosis not present

## 2023-09-10 NOTE — Progress Notes (Signed)
 CONCERNS:                          Sero pos for HSV 1   LMP:                                        No LMP recorded (lmp unknown). LAST PAP SMEAR:                Date: 2019 neg       Hx of abnormal ?:             Pap Smear: No history of abnormal Pap smears MAMMOGRAM:                      Not of appropriate age COLONOSCOPY:                   Not of appropriate age     CONTRACEPTION:                None MENSES:                                Irregular  HX OF STD'S:                         No reported STD's      Annual GYN Note  CC:  Annual visit   HPI: Tina Thomas is a 28 y.o. No obstetric history on file., No LMP recorded (lmp unknown)., here for annual visit.   Thomas reports in Tina fall she was seen in Tina emergency department for some vaginal irritation.  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  Some erythema was noted.  Thomas has never had a rash of painful blisters in Tina past.  She has had BV in Tina past.  Thomas also reports a long history of irregular menses.  Reports that since menarche at age 41 she has had menses that skip a month.  This happens every few months.  She never goes more than 1 month without menses.  She also reports dark hair in her face as well as acne on her face.  She had no difficulty getting pregnant.  She has 1 child who is 47 years old.  She is not currently sexually active.     ROS negative except as above.  ObHx: OB History  No obstetric history on file.    GynHx:     - LMP:  No LMP recorded (lmp unknown). - Contraception: none - H/o abnormal paps: No   PMH:   Past Medical History:  Diagnosis Date   Primary hypertension    Recurrent bacterial vaginitis     PSH:   Past Surgical History:  Procedure Laterality Date   BUNIONECTOMY  2019    WISDOM TOOTH EXTRACTION      FamH:  No family history on file.   SH:  Alcohol Screening: Not on file    Meds:    Current Outpatient Medications on File Prior to Visit  Medication Sig Dispense Refill   ARIPiprazole  (  ABILIFY ) 5 mg tablet Take 1 tablet by mouth daily. (Thomas not taking: Reported on 09/10/2023)     atoMOXetine  (STRATTERA ) 40 mg capsule Take 40 mg by mouth. (Thomas not taking: Reported on 09/10/2023)     lamoTRIgine  (LaMICtal ) 25 mg tablet Take 25 mg by mouth every 12 (twelve) hours. (Thomas not taking: Reported on 09/10/2023)     levocetirizine (XYZAL) 5 mg tablet Take 1 tablet (5 mg total) by mouth every evening for 10 days. 10 tablet 0   traZODone  (DESYREL ) 150 mg tablet Take 150 mg by mouth nightly as needed for sleep. (Thomas not taking: Reported on 09/10/2023)     No current facility-administered medications on file prior to visit.    All:   Allergies  Allergen Reactions   Prochlorperazine  Anxiety and Angioedema     Physical Exam: BP 125/77   Pulse 94   Ht 1.753 m (5' 9)   Wt 65.3 kg (144 lb)   LMP  (LMP Unknown)   BMI 21.27 kg/m  General Appearance:  Well developed, well nourished, no acute distress Psychiatric:  Normal mood and affect, appropriate; alert and oriented to person, place, and time Skin: No rashes or lesion; no induration or nodules noted Lymphatics: No axillary, or inguinal adenopathy noted  Cardiovascular:  Extremities grossly normal, nontender with no edema; Pulmonary: Good air movement with normal work of breathing.  Abdomen: Soft, nontender, nondistended. No masses or hernias appreciated. No rebound or guarding.   Breast:    Inspection: no asymmetry, skin changes, or nipple discharge/blood Palpation: no masses palpated, no tenderness  Pelvic:        External Genitalia: Normal female genitalia w/o masses, lesions, rashes    Urethral Meatus: Normal caliber and position    Urethra: Midline, no masses    Bladder:  Well-suspended, NT    Vagina: mucosa pink and moist without lesions or ulcers     Cervix: No lesions, normal size and consistency; no cervical motion tenderness     Uterus: Normal size and contour; smooth, mobile, NT Adnexa/Parametria: No masses; no parametrial nodularity; no tenderness Perineum/Anus: No lesions, no hemorrhoids        Assessment/Plan:  28 y.o. No obstetric history on file. here for annual visit. Problem List Items Addressed This Visit   None Visit Diagnoses       Screening examination for STD (sexually transmitted disease)    -  Primary   Relevant Orders   Chlamydia / Gonococcus (GC), NAAT   Hepatitis B Surface Antigen (HBSAG) Screen, Qualitative With Confirmation   Hepatitis C Virus (HCV) Antibody Screen With Confirmation   HIV Screen with Reflex to Confirmation   Rapid Plasma Reagin (RPR), Qualitative Test with Reflex to Titer and Confirmation   POC Wet Prep   POC KOH Test, Vaginal     Cervical cancer screening       Relevant Orders   Pap Smear, Liquid Based     Vaginal discharge       Relevant Orders   POC Wet Prep   POC KOH Test, Vaginal        1.  Health maintenance: Counseling and/or handouts provided  Cervical cancer screening: Reviewed ASCCP guidelines for screening.  ordered today    Mammogram: Discussed ACOG recommendation for women at average risk, including risks and benefits associated with screening.  We discussed screening starting at age 63 years, but no later than age 48 years.  We discussed screening mammography every 1-2 years until at least age 19. Reviewed  breast self awareness Not indicated    Colorectal screening: Starting at age 61, colonoscopy every 10 years or as instructed based on findings  Not indicated  DEXA: Reviewed screening recommendations for normal women (age 30) or those at increased risk such as h/o compression fx, fragility fxs, glucocorticoid tx > 3 mo, malabsorption syndrome, primary PTH, propensity to fall,  osteopenia, hypogonadism, menopause prior to age 24, RA, chronic anticonvulsant therapy, smoking, EtOH abuse, excessive caffeine intake, chronic heparin use, wt < 57 kg.   Not indicated   2.  Counseling: Counseling and/or handouts provided regarding:     weight control Pt encouraged to walk 3x/week at a vigorous pace to help control weight, maintain mobility, and improve cardiovascular control.  Also encouraged portion/calorie control and avoidance of empty calories found in juice, soft drinks, and fast food preparations.   3.  Contraception and STI screening Refilled none STI screening:  pan screening ordered.    4. HSV Sero Positive Long discussion with Thomas regarding results of HSV testing.  We discussed that HSV-1 seropositive simply means that she has been exposed to HSV.  This is not a sign of infection.  Discussed that I am unable to determine whether or not she truly has HSV as I did not examine her when she was having symptoms.  Discussed with her typical symptoms of HSV.  Discussed with her that I would not recommend suppression for HSV given no definitive outbreak as well as only being HSV-1 seropositive.  I strongly recommend that if she develops any of these types of symptoms that she present to Tina office so that I can examine her and swab her for HSV.  She expresses understanding of this.  5. Irregular menses Possible PCOS, discussed diagnosis YesOligo- and/or anovulation (fewer than 9 menses/yr) YesClinical and/or biochemical signs of hyperandrogenism - hirsutism, acne, or female pattern balding or biochemical (high serum androgen concentrations)  Unknown Polycystic ovaries (by ultrasound ovarian volume >77ml or >18 antral follicles)  We have notexcluded other etiologies of androgen excess or ovulatory dysfunction  (congenital adrenal hyperplasia, androgen-secreting tumors, and hyperprolactinemia)  Plan to check: serum 17-hydroxyprogesterone,  total and free  testosterone(>150) , DHEAS (>800), TSH, PRL  We discussed Tina minimal criteria for a diagnosis of PCOS: hyperadrogenism (clinically or based on serum testing), oligomenorrhea, and/or polycystic ovaries on ultrasound. We discussed Tina short term and long term implications of PCOS.   We discussed that this is a diagnosis of exclusion, and that laboratory testing to evaluate oligo/amenorrhea should include: prolactin, Estradiol, FSH, TSH (evaluating for hyperprolactinemia, ovarian failure, and thyroid  disease); total testosterone, DHEA-S, 17 hydroxyprogesterone (evaluation of an adrenal source of androgens) in Tina setting of hyperandrogenism.     We discussed that if going more than 90 days without menses that she should be on something for endometrial protection.  Thomas is not going more than 90 days without menses but we discussed risk of hyperplasia or malignancy and that she should notify me if she does start to go more than 90 days without mensesWe discussed options for management such as OCPs, however, Thomas has a history of migraine with aura and therefore is not a candidate for OCP.  We did discuss progesterone only pills but we will await results of testing for further discussion.  Recommend checking HbA1c now.   Orders Placed This Encounter  Procedures   Chlamydia / Gonococcus (GC), NAAT    Standing Status:   Future    Number of Occurrences:   1  Expected Date:   09/10/2023    Expiration Date:   09/09/2024    Release to Thomas::   Immediate   Hepatitis B Surface Antigen (HBSAG) Screen, Qualitative With Confirmation    Standing Status:   Future    Expected Date:   09/10/2023    Expiration Date:   09/09/2024    Release to Thomas::   Immediate   Hepatitis C Virus (HCV) Antibody Screen With Confirmation    Standing Status:   Future    Expected Date:   09/10/2023    Expiration Date:   09/09/2024    Release to Thomas::   Immediate   HIV Screen with Reflex to Confirmation     Standing Status:   Future    Expected Date:   09/10/2023    Expiration Date:   09/09/2024    Release to Thomas::   Immediate   Rapid Plasma Reagin (RPR), Qualitative Test with Reflex to Titer and Confirmation    Standing Status:   Future    Expected Date:   09/10/2023    Expiration Date:   09/09/2024    Release to Thomas::   Immediate   POC Wet Prep   POC KOH Test, Vaginal     Requested Prescriptions    No prescriptions requested or ordered in this encounter      In addition to components of annual visit, I have personally spent 31 minutes involved in face-to-face and non-face-to-face activities for this Thomas on additional diagnoses.  Professional time spent includes Tina following activities, in addition to those noted in Tina documentation:  - preparing to see Tina Thomas -  obtaining and/or reviewing separately obtained history -  performing a medically appropriate examination and/or evaluation -  counseling and educating Tina Thomas - ordering medications, tests or procedures - referring and communicating with other health care professionals - documenting clinical information in Tina electronic or other health record - independently interpreting and communicating results  - care coordination       No follow-ups on file.

## 2023-10-03 NOTE — Progress Notes (Signed)
 Patient left without being seen.

## 2023-10-04 DIAGNOSIS — N898 Other specified noninflammatory disorders of vagina: Secondary | ICD-10-CM | POA: Diagnosis not present

## 2023-10-15 DIAGNOSIS — L700 Acne vulgaris: Secondary | ICD-10-CM | POA: Diagnosis not present

## 2023-11-06 DIAGNOSIS — N898 Other specified noninflammatory disorders of vagina: Secondary | ICD-10-CM | POA: Diagnosis not present

## 2023-11-16 DIAGNOSIS — M542 Cervicalgia: Secondary | ICD-10-CM | POA: Diagnosis not present
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
## 2024-01-20 DIAGNOSIS — L700 Acne vulgaris: Secondary | ICD-10-CM | POA: Diagnosis not present

## 2024-02-18 DIAGNOSIS — M542 Cervicalgia: Secondary | ICD-10-CM | POA: Diagnosis not present

## 2024-02-18 DIAGNOSIS — M25512 Pain in left shoulder: Secondary | ICD-10-CM | POA: Diagnosis not present
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
## 2024-03-13 DIAGNOSIS — N760 Acute vaginitis: Secondary | ICD-10-CM | POA: Diagnosis not present

## 2024-03-13 DIAGNOSIS — B9689 Other specified bacterial agents as the cause of diseases classified elsewhere: Secondary | ICD-10-CM | POA: Diagnosis not present

## 2024-03-13 NOTE — Progress Notes (Signed)
" °  Assessment:   1. BV (bacterial vaginosis)  BV & Candida, NAA vag swab Genital    2. Vaginal odor  POC Urine Dipstick        Plan:   1. Labs and medications ordered today: Orders Placed This Encounter  Procedures   BV & Candida, NAA vag swab Genital   POC Urine Dipstick   Encounter Medications[1]  OTC Tylenol  or NSAID PRN.  Follow up with your PCP.  Go to the ED if worse.  Previsit planning was completed via snapshot and review of chart.  I reviewed the patient instructions on the AVS with the patient/family verbalized to me that they understood what their problem is, what they need to do about it, and why it is important that they do it.  The patient/family voices understanding of all medications. No barriers to adherence were noted. Patient is taking all medications as prescribed and is tolerating well.  Plan for follow-up as discussed or as needed if any worsening symptoms or change in condition.  After Visit Summary was given to patient/or sent to MyChart.   Newell Niels Grew, NP       Subjective:    Tina Thomas is a 28 y.o. female who presents for evaluation of vaginal discharge and odor.   Symptoms have been present for 2 weeks. . Menstrual pattern: bleeding now. Declines STD testing.  History reviewed. No pertinent past medical history.   Allergies[2]     Objective:   BP 122/86 (BP Location: Left Upper Arm, Patient Position: Sitting)   Pulse 76   Temp 98.2 F (36.8 C) (Oral)   Resp 12   Ht 5' 9 (1.753 m)   Wt 144 lb (65.3 kg)   LMP 02/11/2024   SpO2 98%   BMI 21.27 kg/m  General:  Well Developed, Well Nourished, No distress CV:  RRR without MGR  Lungs:  CTA with normal effort Abdomen:  BS active, soft, NTND, No HSM/Masses  Skin:  No focal or suspicious rashes Extremities: No edema or asymmetry.   Pelvic Exam: Deferred .  Recent Results (from the past 14 hours)  POC Urine Dipstick   Collection Time: 03/13/24 10:24 AM  Result Value Ref Range    Color Yellow Yellow   Clarity Clear Clear   Glucose,Urine Negative Negative mg/dL   Bilirubin Negative Negative   Ketones,Urine Negative Negative mg/dL   Specific Gravity 8.974 1.005, 1.010, 1.015, 1.020, 1.025, 1.030   Blood 3+ (A) Negative   pH 6.0    Protein Negative Negative mg/dL   Urobilinogen 0.2 0.2, 1.0 EU/dL   Nitrite Negative Negative   Leukocyte Esterase Negative Negative           [1] Outpatient Encounter Medications as of 03/13/2024  Medication Sig Dispense Refill   metroNIDAZOLE  (FLAGYL ) 500 mg tablet Take one tablet (500 mg dose) by mouth 2 (two) times daily for 7 days. NO ALCOHOL 14 tablet 0   No facility-administered encounter medications on file as of 03/13/2024.  [2] Allergies Allergen Reactions   Compazine  Unknown  "

## 2024-05-26 NOTE — Progress Notes (Signed)
 " Subjective Patient ID: Tina Thomas is a 28 y.o. female.  Chief Complaint  Patient presents with   Vaginal Discharge    Pt c/o vaginal discharge and odor for a month.    The following information was reviewed by members of the visit team:  Tobacco  Allergies  Meds  Problems  Med Hx  Surg Hx  OB Status   Fam Hx      28 yo female with history of recurrent BV with last treatment with Flagyl  gel. She notes symptoms did not completely resolve with gel treatment. Denies new sexual partner but notes current partner x 2 years with infidelity issues.   POS BV 02/2024 treated with flagyl   Vaginal Discharge The patient's primary symptoms include vaginal discharge. The patient's pertinent negatives include no pelvic pain. Pertinent negatives include no abdominal pain, back pain, chills, dysuria, fever, flank pain, frequency, headaches, hematuria, nausea, rash, urgency or vomiting.   Medications Ordered Prior to Encounter[1] Allergies[2]  Review of Systems  Constitutional:  Negative for chills, fatigue and fever.  Respiratory:  Negative for chest tightness.   Cardiovascular:  Negative for chest pain.  Gastrointestinal:  Negative for abdominal pain, nausea and vomiting.  Genitourinary:  Positive for vaginal discharge. Negative for difficulty urinating, dysuria, flank pain, frequency, genital sores, hematuria, menstrual problem, pelvic pain and urgency.  Musculoskeletal:  Negative for back pain.  Skin:  Negative for color change, rash and wound.  Neurological:  Negative for dizziness, weakness and headaches.  Psychiatric/Behavioral:  Negative for confusion.    BP 114/74   Pulse 79   Temp 97.5 F (36.4 C) (Temporal)   Resp 18   Ht 1.753 m (5' 9)   Wt 62.6 kg (138 lb)   LMP 05/03/2024 (Approximate)   SpO2 98%   BMI 20.38 kg/m   Objective Physical Exam Vitals and nursing note reviewed.  Constitutional:      General: She is not in acute distress.    Appearance: Normal  appearance. She is not ill-appearing.  HENT:     Head: Normocephalic and atraumatic.     Right Ear: External ear normal.     Left Ear: External ear normal.  Eyes:     General: No scleral icterus.       Right eye: No discharge.        Left eye: No discharge.     Conjunctiva/sclera: Conjunctivae normal.  Cardiovascular:     Rate and Rhythm: Normal rate and regular rhythm.     Pulses: Normal pulses.     Heart sounds: Normal heart sounds.  Pulmonary:     Effort: Pulmonary effort is normal. No respiratory distress.     Breath sounds: Normal breath sounds. No wheezing, rhonchi or rales.  Abdominal:     General: Abdomen is flat. Bowel sounds are normal. There is no distension.     Palpations: Abdomen is soft.     Tenderness: There is no abdominal tenderness. There is no right CVA tenderness, left CVA tenderness or guarding.  Genitourinary:    Comments: Declines exams/ defer to gyn Musculoskeletal:        General: Normal range of motion.     Cervical back: Normal range of motion.  Skin:    General: Skin is warm.     Capillary Refill: Capillary refill takes less than 2 seconds.  Neurological:     General: No focal deficit present.     Mental Status: She is alert and oriented to person, place, and time.  Motor: No weakness.     Gait: Gait normal.  Psychiatric:        Thought Content: Thought content normal.        Judgment: Judgment normal.    Results for orders placed or performed in visit on 05/26/24  POC Urinalysis Auto without Microscopic  Result Value Ref Range   Color, Urine Yellow Yellow   Clarity, Urine Clear Clear   Glucose, Urine Negative Negative mg/dL   Bilirubin, Urine Negative Negative   Ketones, Urine Negative Negative mg/dL   Specific Gravity, Urine 1.025 1.010, 1.015, 1.020, 1.025   Blood, Urine Negative Negative   pH, Urine 5.5 5.0, 5.5, 6.0, 6.5, 7.0, 7.5, 8.0   Protein, Urine Negative Negative mg/dL   Urobilinogen, Urine 0.2 <2.0 mg/dL   Nitrite, Urine  Negative Negative   Leukocyte Esterase, Urine Negative Negative   Kit/Device Lot # [id number]    Kit/Device Expiration Date 02/18/2025   POC HCG Qualitative, Urine  Result Value Ref Range   HCG, Urine, POC Negative Negative   Internal Control Acceptable    Kit/Device Lot # [id number]    Kit/Device Expiration Date 06/20/2025     Assessment/Plan 1. Vaginal discharge  POC Urinalysis Auto without Microscopic   Wet Prep   Trichomonas vaginalis, Qualitative NAAT   Chlamydia / Gonococcus (GC), NAAT   Wet Prep   POC HCG Qualitative, Urine    2. Possible exposure to STI  Wet Prep   Trichomonas vaginalis, Qualitative NAAT   Chlamydia / Gonococcus (GC), NAAT   Wet Prep   POC HCG Qualitative, Urine     -Check labs and will call with any abnormal results -no intercourse until labs compete and treatment finished -advised decrease carbs and sugars in diet. Add probiotic OTC -follow up GYN for pap and recheck in 1 month   Electronically signed: Melissa Raylene Smith, PA-C 05/26/2024  4:33 PM        [1] Current Outpatient Medications on File Prior to Visit  Medication Sig Dispense Refill   ARIPiprazole  (ABILIFY ) 5 mg tablet Take 1 tablet by mouth daily. (Patient not taking: Reported on 05/26/2024)     atoMOXetine  (STRATTERA ) 40 mg capsule Take 40 mg by mouth. (Patient not taking: Reported on 05/26/2024)     cyclobenzaprine (FLEXERIL) 10 mg tablet Take 1 tablet (10 mg total) by mouth 2 (two) times a day as needed for muscle spasms. (Patient not taking: Reported on 05/26/2024) 20 tablet 0   lamoTRIgine  (LaMICtal ) 25 mg tablet Take 25 mg by mouth every 12 (twelve) hours. (Patient not taking: Reported on 05/26/2024)     levocetirizine (XYZAL) 5 mg tablet Take 1 tablet (5 mg total) by mouth every evening for 10 days. (Patient not taking: Reported on 05/26/2024) 10 tablet 0   metroNIDAZOLE  (FLAGYL ) 500 mg tablet Take 1 tablet (500 mg total) by mouth in the morning and 1 tablet (500 mg total) at  noon. Do all this for 7 days. (Patient not taking: Reported on 05/26/2024) 14 tablet 0   metroNIDAZOLE  (METROGEL ) 0.75 % (37.5mg /5 gram) vaginal gel 1 applicatorful PV at bedtime x 5 days (Patient not taking: Reported on 05/26/2024) 70 g 0   traZODone  (DESYREL ) 150 mg tablet Take 150 mg by mouth nightly as needed for sleep. (Patient not taking: Reported on 05/26/2024)     No current facility-administered medications on file prior to visit.  [2] Allergies Allergen Reactions   Prochlorperazine  Angioedema, Anxiety and Other (See Comments)  "

## 2024-05-27 NOTE — Progress Notes (Signed)
 Trich still pending. GC Negative. POSITIVE FOR Bacterial Vaginosis. Flagyl  sent to pharmacy. Do not drink alcohol with medicine.  Take diflucan  after completes 7 day course. No intercourse x 10 days. Follow up with Gynecology or primary care if symptoms do not improve.

## 2024-05-28 NOTE — Progress Notes (Signed)
 Trich negative. Continue current plan as discussed with visit and previous lab results.

## 2024-08-10 ENCOUNTER — Ambulatory Visit: Admitting: Certified Nurse Midwife

## 2024-08-10 NOTE — Progress Notes (Signed)
 Patient did not come to appointment. May reschedule.  Camie Rote, MSN, CNM, RNC-OB Certified Nurse Midwife, [facility] 08/10/2024 11:36 AM
